# Patient Record
Sex: Male | Born: 1955 | Race: White | Hispanic: No | Marital: Single | Smoking: Current every day smoker
Health system: Southern US, Community
[De-identification: ages and names within clinical notes are randomized; demographics above are authoritative.]

## PROBLEM LIST (undated history)

## (undated) DIAGNOSIS — I10 Essential (primary) hypertension: Secondary | ICD-10-CM

## (undated) DIAGNOSIS — E785 Hyperlipidemia, unspecified: Secondary | ICD-10-CM

## (undated) DIAGNOSIS — E119 Type 2 diabetes mellitus without complications: Secondary | ICD-10-CM

## (undated) HISTORY — DX: Hyperlipidemia, unspecified: E78.5

## (undated) HISTORY — DX: Essential (primary) hypertension: I10

---
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
## 1981-10-07 HISTORY — PX: APPENDECTOMY: SHX54

## 2010-03-06 ENCOUNTER — Emergency Department (HOSPITAL_COMMUNITY): Admission: EM | Admit: 2010-03-06 | Discharge: 2010-03-06 | Payer: Self-pay | Admitting: Emergency Medicine

## 2016-06-16 ENCOUNTER — Inpatient Hospital Stay (HOSPITAL_COMMUNITY)
Admission: EM | Admit: 2016-06-16 | Discharge: 2016-06-18 | DRG: 872 | Disposition: A | Discharge status: Home / Self Care | Payer: Managed Care, Other (non HMO) | Attending: Internal Medicine | Admitting: Internal Medicine

## 2016-06-16 ENCOUNTER — Emergency Department (HOSPITAL_COMMUNITY): Payer: Managed Care, Other (non HMO)

## 2016-06-16 ENCOUNTER — Encounter (HOSPITAL_COMMUNITY): Payer: Self-pay | Admitting: Emergency Medicine

## 2016-06-16 DIAGNOSIS — R739 Hyperglycemia, unspecified: Secondary | ICD-10-CM | POA: Diagnosis present

## 2016-06-16 DIAGNOSIS — R000 Tachycardia, unspecified: Secondary | ICD-10-CM | POA: Diagnosis present

## 2016-06-16 DIAGNOSIS — A419 Sepsis, unspecified organism: Principal | ICD-10-CM | POA: Diagnosis present

## 2016-06-16 DIAGNOSIS — K047 Periapical abscess without sinus: Secondary | ICD-10-CM

## 2016-06-16 DIAGNOSIS — R220 Localized swelling, mass and lump, head: Secondary | ICD-10-CM | POA: Diagnosis not present

## 2016-06-16 DIAGNOSIS — F17210 Nicotine dependence, cigarettes, uncomplicated: Secondary | ICD-10-CM | POA: Diagnosis present

## 2016-06-16 DIAGNOSIS — R17 Unspecified jaundice: Secondary | ICD-10-CM | POA: Diagnosis present

## 2016-06-16 DIAGNOSIS — K920 Hematemesis: Secondary | ICD-10-CM | POA: Diagnosis present

## 2016-06-16 DIAGNOSIS — R112 Nausea with vomiting, unspecified: Secondary | ICD-10-CM | POA: Diagnosis not present

## 2016-06-16 DIAGNOSIS — D751 Secondary polycythemia: Secondary | ICD-10-CM | POA: Diagnosis present

## 2016-06-16 DIAGNOSIS — F17200 Nicotine dependence, unspecified, uncomplicated: Secondary | ICD-10-CM | POA: Diagnosis present

## 2016-06-16 DIAGNOSIS — K121 Other forms of stomatitis: Secondary | ICD-10-CM | POA: Diagnosis not present

## 2016-06-16 LAB — COMPREHENSIVE METABOLIC PANEL
ALK PHOS: 90 U/L (ref 38–126)
ALT: 23 U/L (ref 17–63)
AST: 26 U/L (ref 15–41)
Albumin: 4.4 g/dL (ref 3.5–5.0)
Anion gap: 15 (ref 5–15)
BILIRUBIN TOTAL: 1.9 mg/dL — AB (ref 0.3–1.2)
BUN: 13 mg/dL (ref 6–20)
CALCIUM: 9.7 mg/dL (ref 8.9–10.3)
CHLORIDE: 101 mmol/L (ref 101–111)
CO2: 22 mmol/L (ref 22–32)
CREATININE: 0.94 mg/dL (ref 0.61–1.24)
GFR calc Af Amer: >60 mL/min (ref >60)
Glucose, Bld: 362 mg/dL — ABNORMAL HIGH (ref 65–99)
Potassium: 4.1 mmol/L (ref 3.5–5.1)
Sodium: 138 mmol/L (ref 135–145)
Total Protein: 8.3 g/dL — ABNORMAL HIGH (ref 6.5–8.1)

## 2016-06-16 LAB — CBC WITH DIFFERENTIAL/PLATELET
Basophils Absolute: 0.1 10*3/uL (ref 0.0–0.1)
Basophils Relative: 0 %
Eosinophils Absolute: 0.4 10*3/uL (ref 0.0–0.7)
Eosinophils Relative: 2 %
HEMATOCRIT: 52.1 % — AB (ref 39.0–52.0)
Hemoglobin: 18.7 g/dL — ABNORMAL HIGH (ref 13.0–17.0)
LYMPHS ABS: 2.3 10*3/uL (ref 0.7–4.0)
LYMPHS PCT: 10 %
MCH: 30.1 pg (ref 26.0–34.0)
MCHC: 35.9 g/dL (ref 30.0–36.0)
MCV: 83.8 fL (ref 78.0–100.0)
MONO ABS: 1.8 10*3/uL — AB (ref 0.1–1.0)
MONOS PCT: 8 %
Neutro Abs: 18.7 10*3/uL — ABNORMAL HIGH (ref 1.7–7.7)
Neutrophils Relative %: 80 %
Platelets: 226 10*3/uL (ref 150–400)
RBC: 6.22 MIL/uL — ABNORMAL HIGH (ref 4.22–5.81)
RDW: 13.6 % (ref 11.5–15.5)
WBC: 23.3 10*3/uL — AB (ref 4.0–10.5)

## 2016-06-16 LAB — I-STAT CG4 LACTIC ACID, ED: Lactic Acid, Venous: 1.07 mmol/L (ref 0.5–1.9)

## 2016-06-16 MED ORDER — MORPHINE SULFATE (PF) 4 MG/ML IV SOLN
4.0000 mg | Freq: Once | INTRAVENOUS | Status: AC
Start: 2016-06-16 — End: 2016-06-16
  Administered 2016-06-16: 4 mg via INTRAVENOUS
  Filled 2016-06-16: qty 1

## 2016-06-16 MED ORDER — ONDANSETRON HCL 4 MG/2ML IJ SOLN
4.0000 mg | Freq: Once | INTRAMUSCULAR | Status: AC
  Administered 2016-06-16: 4 mg via INTRAVENOUS
  Filled 2016-06-16: qty 2

## 2016-06-16 MED ORDER — ACETAMINOPHEN 325 MG PO TABS
650.0000 mg | ORAL_TABLET | Freq: Four times a day (QID) | ORAL | Status: DC | PRN
  Administered 2016-06-17 (×2): 650 mg via ORAL
  Filled 2016-06-16 (×2): qty 2

## 2016-06-16 MED ORDER — MORPHINE SULFATE (PF) 4 MG/ML IV SOLN
4.0000 mg | INTRAVENOUS | Status: DC | PRN
  Administered 2016-06-17 (×2): 4 mg via INTRAVENOUS
  Filled 2016-06-16 (×2): qty 1

## 2016-06-16 MED ORDER — SODIUM CHLORIDE 0.9 % IV BOLUS (SEPSIS)
30.0000 mL/kg | Freq: Once | INTRAVENOUS | Status: AC
  Administered 2016-06-16: 2000 mL via INTRAVENOUS

## 2016-06-16 MED ORDER — ONDANSETRON HCL 4 MG/2ML IJ SOLN: 4.0000 mg | Freq: Four times a day (QID) | INTRAMUSCULAR | Status: DC | PRN

## 2016-06-16 MED ORDER — ONDANSETRON HCL 4 MG PO TABS: 4.0000 mg | ORAL_TABLET | Freq: Four times a day (QID) | ORAL | Status: DC | PRN

## 2016-06-16 MED ORDER — MORPHINE SULFATE (PF) 4 MG/ML IV SOLN
4.0000 mg | Freq: Once | INTRAVENOUS | Status: AC
  Administered 2016-06-16: 4 mg via INTRAVENOUS
  Filled 2016-06-16: qty 1

## 2016-06-16 MED ORDER — SODIUM CHLORIDE 0.9% FLUSH
3.0000 mL | Freq: Two times a day (BID) | INTRAVENOUS | Status: DC
  Administered 2016-06-17: 3 mL via INTRAVENOUS

## 2016-06-16 MED ORDER — SODIUM CHLORIDE 0.9 % IV SOLN
3.0000 g | Freq: Four times a day (QID) | INTRAVENOUS | Status: DC
  Administered 2016-06-16 – 2016-06-18 (×7): 3 g via INTRAVENOUS
  Filled 2016-06-16 (×8): qty 3

## 2016-06-16 MED ORDER — SODIUM CHLORIDE 0.9 % IV SOLN
INTRAVENOUS | Status: DC
  Administered 2016-06-17 – 2016-06-18 (×2): via INTRAVENOUS

## 2016-06-16 MED ORDER — KETOROLAC TROMETHAMINE 30 MG/ML IJ SOLN
30.0000 mg | Freq: Once | INTRAMUSCULAR | Status: AC
  Administered 2016-06-16: 30 mg via INTRAVENOUS
  Filled 2016-06-16: qty 1

## 2016-06-16 MED ORDER — SODIUM CHLORIDE 0.9 % IV BOLUS (SEPSIS)
1000.0000 mL | Freq: Once | INTRAVENOUS | Status: AC
  Administered 2016-06-16: 1000 mL via INTRAVENOUS

## 2016-06-16 MED ORDER — KETOROLAC TROMETHAMINE 30 MG/ML IJ SOLN
30.0000 mg | Freq: Four times a day (QID) | INTRAMUSCULAR | Status: DC | PRN
  Administered 2016-06-17 – 2016-06-18 (×4): 30 mg via INTRAVENOUS
  Filled 2016-06-16 (×4): qty 1

## 2016-06-16 MED ORDER — IOPAMIDOL (ISOVUE-300) INJECTION 61%
100.0000 mL | Freq: Once | INTRAVENOUS | Status: AC | PRN
  Administered 2016-06-16: 100 mL via INTRAVENOUS

## 2016-06-16 MED ORDER — INSULIN ASPART 100 UNIT/ML ~~LOC~~ SOLN
0.0000 [IU] | Freq: Three times a day (TID) | SUBCUTANEOUS | Status: DC
Start: 2016-06-17 — End: 2016-06-17
  Administered 2016-06-17 (×2): 5 [IU] via SUBCUTANEOUS

## 2016-06-16 MED ORDER — FAMOTIDINE IN NACL 20-0.9 MG/50ML-% IV SOLN
20.0000 mg | Freq: Two times a day (BID) | INTRAVENOUS | Status: DC
  Administered 2016-06-16 – 2016-06-18 (×4): 20 mg via INTRAVENOUS
  Filled 2016-06-16 (×5): qty 50

## 2016-06-16 NOTE — ED Provider Notes (Signed)
[facility] DEPT Provider Note   CSN: [id number] Arrival date & time: 06/16/16  1545     History   Chief Complaint Chief Complaint  Patient presents with  . Hematemesis    HPI Steve Keller is a 60 y.o. male.  HPI here for evaluation of hematemesis. Patient reports he had a dental infection and gradually got worse since yesterday, went to a walk-in clinic today where he was prescribed amoxicillin. He reports taking his amoxicillin at 9:00 AM and since that time has thrown up bloody emesis twice. He also reports associated epistaxis. Reports right cheek pain. Denies overt chest pain, shortness of breath, cough, back pain, abdominal pain, headache, numbness or weakness, fevers or chills. Denies any other medical problems, no other medications. Nothing makes this problem better or worse. No other modifying factors  History reviewed. No pertinent past medical history.  There are no active problems to display for this patient.   Past Surgical History:  Procedure Laterality Date  . APPENDECTOMY         Home Medications    Prior to Admission medications   Not on File    Family History No family history on file.  Social History Social History  Substance Use Topics  . Smoking status: Current Every Day Smoker    Types: Cigarettes  . Smokeless tobacco: Never Used  . Alcohol use No     Allergies   Review of patient's allergies indicates no known allergies.   Review of Systems Review of Systems A 10 point review of systems was completed and was negative except for pertinent positives and negatives as mentioned in the history of present illness    Physical Exam Updated Vital Signs BP (!) 159/108 (BP Location: Left Arm)   Pulse (!) 123   Temp 97.9 F (36.6 C) (Oral)   Resp 20   Ht 6\' 1"  (1.854 m)   Wt 109.3 kg   SpO2 94%   BMI 31.80 kg/m   Physical Exam  Constitutional: He appears well-developed and well-nourished. No distress.  Awake, alert and  nontoxic in appearance  HENT:  Head: Normocephalic and atraumatic.  Right Ear: External ear normal.  Left Ear: External ear normal.  Mouth/Throat: Oropharynx is clear and moist.  Overall poor dentition. Multiple missing teeth with active caries. Worst in right lower molars Mucous membranes are moist. No unilateral tonsillar swelling, uvula midline, no glossal swelling or elevation. No trismus. No fluctuance or evidence of a drainable abscess. No evidence of Retropharyngeal or Peritonsillar abscess, Ludwig or Vincents angina. Tolerating secretions well. Patent airway  He does have exquisite tenderness over right maxillary sinus and cheek area, slightly erythematous and is warm.   Eyes: Conjunctivae and EOM are normal. Pupils are equal, round, and reactive to light.  Neck: Normal range of motion. Neck supple. No JVD present.  Cardiovascular: Regular rhythm and normal heart sounds.   Tachycardic with normal heart sounds.  Pulmonary/Chest: Effort normal and breath sounds normal. No stridor. No respiratory distress.  Abdominal: Soft. He exhibits no distension. There is no tenderness.  Musculoskeletal: Normal range of motion.  Neurological: He is alert.  Awake, alert, cooperative and aware of situation; motor strength bilaterally; sensation normal to light touch bilaterally; no facial asymmetry; tongue midline; major cranial nerves appear intact;  baseline gait without new ataxia.  Skin: No rash noted. He is not diaphoretic.  Psychiatric: He has a normal mood and affect. His behavior is normal. Thought content normal.  Nursing note and vitals reviewed.  ED Treatments / Results  Labs (all labs ordered are listed, but only abnormal results are displayed) Labs Reviewed  COMPREHENSIVE METABOLIC PANEL - Abnormal; Notable for the following:       Result Value   Glucose, Bld 362 (*)    Total Protein 8.3 (*)    Total Bilirubin 1.9 (*)    All other components within normal limits  CBC WITH  DIFFERENTIAL/PLATELET - Abnormal; Notable for the following:    WBC 23.3 (*)    RBC 6.22 (*)    Hemoglobin 18.7 (*)    HCT 52.1 (*)    Neutro Abs 18.7 (*)    Monocytes Absolute 1.8 (*)    All other components within normal limits  CULTURE, BLOOD (ROUTINE X 2)  CULTURE, BLOOD (ROUTINE X 2)  I-STAT CG4 LACTIC ACID, ED    EKG  EKG Interpretation None       Radiology Ct Maxillofacial W Contrast  Result Date: 06/16/2016 CLINICAL DATA:  Right cheek swelling and redness. EXAM: CT MAXILLOFACIAL WITH CONTRAST TECHNIQUE: Multidetector CT imaging of the maxillofacial structures was performed with intravenous contrast. Multiplanar CT image reconstructions were also generated. A small metallic BB was placed on the right temple in order to reliably differentiate right from left. CONTRAST:  167mL ISOVUE-300 IOPAMIDOL (ISOVUE-300) INJECTION 61% COMPARISON:  None FINDINGS: Very poor dentition with multiple upper and lower periapical lucencies. There is a periapical lucency involving the right upper second molar. There is adjacent mucosal thickening involving the right maxillary sinus. There is a small periosteal fluid collection overlying the anterior lateral aspect of the right side of maxilla which measures 7 by 18 mm, image 39 of series 13. Overlying subcutaneous fat stranding and skin thickening is identified. Prominent right submandibular lymph node 9 mm in short axis. Right level-II node measures 1 cm, image 19 of series 3. The visualized intracranial contents are unremarkable. IMPRESSION: 1. Overall poor dentition with multiple periapical lucencies. 2. Right upper second molar periapical abscess with associated right maxillary sinus inflammation. Additionally, there is a small periosteal fluid collection/abscess associated with the right maxilla and overlying cellulitis. 3. Borderline enlarged right level 1 and 2 nodes are likely reactive. Electronically Signed   By: Kerby Moors M.D.   On: 06/16/2016  18:04   Dg Abd Acute W/chest  Result Date: 06/16/2016 CLINICAL DATA:  Vomiting EXAM: DG ABDOMEN ACUTE W/ 1V CHEST COMPARISON:  None. FINDINGS: PA view chest demonstrates mild linear atelectasis or scarring in the left base. No acute infiltrate or consolidation. No effusion. Heart size is normal. Mild atherosclerosis of the aorta. Supine and upright views of the abdomen demonstrate no free air beneath the diaphragm. Bowel gas pattern nonspecific with relatively decreased small bowel gas. There is gas and stool present in the colon. There are no pathologic calcifications. Ovoid soft tissue opacity in the pelvis, it is felt consistent with bladder. No acute osseous abnormality. IMPRESSION: 1. Mild scarring or atelectasis in the left lung base 2. Nonspecific nonobstructed bowel gas pattern Electronically Signed   By: Donavan Foil M.D.   On: 06/16/2016 17:28    Procedures Procedures (including critical care time)  Medications Ordered in ED Medications  sodium chloride 0.9 % bolus 1,000 mL (0 mLs Intravenous Stopped 06/16/16 1810)  morphine 4 MG/ML injection 4 mg (4 mg Intravenous Given 06/16/16 1701)  ondansetron (ZOFRAN) injection 4 mg (4 mg Intravenous Given 06/16/16 1701)  iopamidol (ISOVUE-300) 61 % injection 100 mL (100 mLs Intravenous Contrast Given 06/16/16 1738)  sodium  chloride 0.9 % bolus 3,279 mL (1,000 mLs Intravenous New Bag/Given 06/16/16 1809)  morphine 4 MG/ML injection 4 mg (4 mg Intravenous Given 06/16/16 1816)     Initial Impression / Assessment and Plan / ED Course  I have reviewed the triage vital signs and the nursing notes.  Pertinent labs & imaging results that were available during my care of the patient were reviewed by me and considered in my medical decision making (see chart for details).  Clinical Course    On arrival, patient tachycardic to 130s. Exam concerning for dental infection versus maxillary sinus infection. We will obtain CT maxillofacial for further  appreciation. Reports forceful vomiting, obtain acute abdomen/chest to rule out possible perforation. Pending labs. White count 23. Multiple periapical Lucencies on CT maxillofacial. Patient will need admission for IV antibiotics for multiple dental abscesses. Patient treated for sepsis secondary to dental abscess. Started on Unasyn. Cultures obtained, fluids given. Discussed with hospitalist, Dr. Olevia Bowens. Will see in ED.   Final Clinical Impressions(s) / ED Diagnoses   Final diagnoses:  Dental infection  Sepsis, due to unspecified organism [facility])    New Prescriptions New Prescriptions   No medications on file     Comer Locket, PA-C 06/16/16 2108    Leo Grosser, MD 06/17/16 1049

## 2016-06-16 NOTE — ED Triage Notes (Signed)
Patient states that he was seen by walk in clinic earlier for dental abscess and was given antibiotics and took one this morning then home resting then got abd tightness then vomited dark red blood x 3.

## 2016-06-16 NOTE — H&P (Signed)
History and Physical    Steve Keller [id number] DOB: 1956/02/25 DOA: 06/16/2016  PCP: No primary care provider on file.   Patient coming from: Home.  Chief Complaint: Dental abscess.  HPI: Steve Keller is a 60 y.o. male with medical/surgical history significant of appendectomy who comes into the emergency department due to dental abscess symptoms since Friday.  Per patient, he was driving back to [place] from [place], where he works, when he had sudden pain in his right maxillary area. He is states that the pain became so intense quickly, that he had to stop driving and take some over-the-counter analgesics. He is states that the pain improved on Saturday, but then worsening again, so he went to the walk-in clinic this morning for evaluation. He was prescribed amoxicillin, went home and shortly after he took the first amoxicillin tablet, he developed abdominal pain with nausea followed by 3 episodes of emesis which the patient describes as having dark red blood. He states that he has taken numerous tablets of Excedrin for pain. He denies fever, but feels chills and rigors.  He denies chest pain, palpitations, dizziness, diaphoresis, dyspnea, pitting edema of the lower extremities, PND or orthopnea.He denies melena, hematochezia, or GU symptoms.  ED Course: The patient received 3+ liters of IV fluid bolus, Zofran, morphine 8 mg divided in 2 doses, Toradol 30 mg IVP and was started on Unasyn 3 g every 3 hours. Workup showed leukocytosis of 20 3.3K, hemoglobin level of 18.7, hyperglycemia 0367 mg/dL and CT scan imaging revealed a right upper second molar periapical abscess with associated right maxillary sinus inflammation (see report for further detail).   Review of Systems: As per HPI otherwise 10 point review of systems negative.    History reviewed. No pertinent past medical history.  Past Surgical History:  Procedure Laterality Date  . APPENDECTOMY       reports that he  has been smoking Cigarettes.  He has never used smokeless tobacco. He reports that he does not drink alcohol. His drug history is not on file.  No Known Allergies  Family History  Problem Relation Age of Onset  . CVA Father   . Diabetes Mellitus II Father     Prior to Admission medications   Medication Sig Start Date End Date Taking? Authorizing Provider  acetaminophen (TYLENOL) 500 MG tablet Take 1,000 mg by mouth every 6 (six) hours as needed for mild pain, moderate pain or headache.   Yes Historical Provider, MD    Physical Exam: Vitals:   06/16/16 1607 06/16/16 1608 06/16/16 1722 06/16/16 1816  BP:  (!) 168/111 (!) 158/112 (!) 159/108  Pulse:  (!) 135 (!) 131 (!) 123  Resp:  19 18 20   Temp:  97.9 F (36.6 C)    TempSrc:  Oral    SpO2:  97%  94%  Weight: 109.3 kg (241 lb)     Height: 6\' 1"  (1.854 m)         Constitutional: NAD, calm, comfortable Vitals:   06/16/16 1607 06/16/16 1608 06/16/16 1722 06/16/16 1816  BP:  (!) 168/111 (!) 158/112 (!) 159/108  Pulse:  (!) 135 (!) 131 (!) 123  Resp:  19 18 20   Temp:  97.9 F (36.6 C)    TempSrc:  Oral    SpO2:  97%  94%  Weight: 109.3 kg (241 lb)     Height: 6\' 1"  (1.854 m)      Eyes: PERRL, lids and conjunctivae normal ENMT: Positive edema with mild  erythema and tenderness to palpation in right maxillary area. Mucous membranes are moist. Posterior pharynx clear of any exudate or lesions. Poor dentition with numerous missing teeth and multiple cavities. Neck: supple, right-sided cervical adenopathies, no thyromegaly Respiratory: clear to auscultation bilaterally, no wheezing, no crackles. Normal respiratory effort. No accessory muscle use.  Cardiovascular: Tachycardic at 114 BPM, no murmurs / rubs / gallops. No extremity edema. 2+ pedal pulses. No carotid bruits.  Abdomen: no tenderness, no masses palpated. No hepatosplenomegaly. Bowel sounds positive.  Musculoskeletal: no clubbing / cyanosis. No joint deformity upper and  lower extremities. Good ROM, no contractures. Normal muscle tone.  Skin: no rashes, lesions, ulcers. No induration Neurologic: CN 2-12 grossly intact. Sensation intact, DTR normal. Strength 5/5 in all 4.  Psychiatric: Normal judgment and insight. Alert and oriented x 4. Normal mood.     Labs on Admission: I have personally reviewed following labs and imaging studies  CBC:  Recent Labs Lab 06/16/16 1645  WBC 23.3*  NEUTROABS 18.7*  HGB 18.7*  HCT 52.1*  MCV 83.8  PLT [id number]   Basic Metabolic Panel:  Recent Labs Lab 06/16/16 1645  NA 138  K 4.1  CL 101  CO2 22  GLUCOSE 362*  BUN 13  CREATININE 0.94  CALCIUM 9.7   GFR: Estimated Creatinine Clearance: 108.4 mL/min (by C-G formula based on SCr of 0.94 mg/dL). Liver Function Tests:  Recent Labs Lab 06/16/16 1645  AST 26  ALT 23  ALKPHOS 90  BILITOT 1.9*  PROT 8.3*  ALBUMIN 4.4     Radiological Exams on Admission: Ct Maxillofacial W Contrast  Result Date: 06/16/2016 CLINICAL DATA:  Right cheek swelling and redness. EXAM: CT MAXILLOFACIAL WITH CONTRAST TECHNIQUE: Multidetector CT imaging of the maxillofacial structures was performed with intravenous contrast. Multiplanar CT image reconstructions were also generated. A small metallic BB was placed on the right temple in order to reliably differentiate right from left. CONTRAST:  116mL ISOVUE-300 IOPAMIDOL (ISOVUE-300) INJECTION 61% COMPARISON:  None FINDINGS: Very poor dentition with multiple upper and lower periapical lucencies. There is a periapical lucency involving the right upper second molar. There is adjacent mucosal thickening involving the right maxillary sinus. There is a small periosteal fluid collection overlying the anterior lateral aspect of the right side of maxilla which measures 7 by 18 mm, image 39 of series 13. Overlying subcutaneous fat stranding and skin thickening is identified. Prominent right submandibular lymph node 9 mm in short axis. Right level-II  node measures 1 cm, image 19 of series 3. The visualized intracranial contents are unremarkable. IMPRESSION: 1. Overall poor dentition with multiple periapical lucencies. 2. Right upper second molar periapical abscess with associated right maxillary sinus inflammation. Additionally, there is a small periosteal fluid collection/abscess associated with the right maxilla and overlying cellulitis. 3. Borderline enlarged right level 1 and 2 nodes are likely reactive. Electronically Signed   By: Kerby Moors M.D.   On: 06/16/2016 18:04   Dg Abd Acute W/chest  Result Date: 06/16/2016 CLINICAL DATA:  Vomiting EXAM: DG ABDOMEN ACUTE W/ 1V CHEST COMPARISON:  None. FINDINGS: PA view chest demonstrates mild linear atelectasis or scarring in the left base. No acute infiltrate or consolidation. No effusion. Heart size is normal. Mild atherosclerosis of the aorta. Supine and upright views of the abdomen demonstrate no free air beneath the diaphragm. Bowel gas pattern nonspecific with relatively decreased small bowel gas. There is gas and stool present in the colon. There are no pathologic calcifications. Ovoid soft tissue opacity in  the pelvis, it is felt consistent with bladder. No acute osseous abnormality. IMPRESSION: 1. Mild scarring or atelectasis in the left lung base 2. Nonspecific nonobstructed bowel gas pattern Electronically Signed   By: Donavan Foil M.D.   On: 06/16/2016 17:28     Assessment/Plan Principal Problem:   Sepsis due to oral infection ([place]) Admit to telemetry a/inpatient. Continue normal saline boluses, then maintenance IV fluids. Tylenol 650 mg by mouth every 6 hours when necessary. Toradol 30 mg IVP every 6 hours when necessary Morphine 4 mg IVP every 3 hours when necessary. Antiemetics as needed. Continue Unasyn 3 g every 6 hours. Patient advised to follow-up with dentist as an outpatient in the future.  Active Problems:   Nausea and vomiting Per patient, he had 3 episodes of  hematemesis.  He has stated that he has been taking Excedrin often for this pain.  Check stool occult blood.  Continue IV hydration. Famotidine 20 mg IVP every 12 hours. Zofran 4 mg oral/IVP every 4 hours.    Hyperglycemia The patient denies polyuria, polydipsia or blurred vision. Start CBG monitoring a regular insulin sliding scale. Check hemoglobin A1c.    Sinus tachycardia Check electrocardiogram. Continue IV hydration and pain control. Check echocardiogram if it persists despite hydration and treatment.      Polycythemia Advised to cease smoking. Consider daily aspirin 81 mg prophylaxis as an outpatient, once GI symptoms resolved. Follow-up with PCP and/or hematology as an outpatient.    Hyperbilirubinemia No history of liver disease per patient. States that he drinks very seldom and in limited amounts.  Follow hepatic liver function test in a.m.    Tobacco use disorder Smokes about one third of a pack of cigarettes a day. Declined nicotine replacement therapy.     DVT prophylaxis: SCDs. Code Status: Full code. Family Communication:  Disposition Plan: Admit for IV antibiotic therapy and further evaluation. Consults called:  Admission status: Telemetry/inpatient.   Reubin Milan MD [facility] Pager [phone].(  If 7PM-7AM, please contact night-coverage [email or web address] Password [email or web address]  06/16/2016, 7:52 PM

## 2016-06-16 NOTE — Progress Notes (Signed)
Pharmacy Antibiotic Note  Steve Keller is a 60 y.o. male admitted on 06/16/2016 with dental abscess.  Pharmacy has been consulted for ampicillin/sulbactam dosing.  Today, 06/16/2016:  Renal: Scr WNL  CBC: WBC elevated  Plan:  Ampicillin/sulbactam 3gm IV q6h  Do not anticipate need for dose adjustment, pharmacy will sign-off  Height: 6\' 1"  (185.4 cm) Weight: 241 lb (109.3 kg) IBW/kg (Calculated) : 79.9  Temp (24hrs), Avg:97.9 F (36.6 C), Min:97.9 F (36.6 C), Max:97.9 F (36.6 C)   Recent Labs Lab 06/16/16 1645  WBC 23.3*  CREATININE 0.94    Estimated Creatinine Clearance: 108.4 mL/min (by C-G formula based on SCr of 0.94 mg/dL).    No Known Allergies  Antimicrobials this admission: Unasyn 9/10 >>  Dose adjustments this admission:  Microbiology results: No cultures  Thank you for allowing pharmacy to be a part of this patient's care.  Doreene Eland, PharmD, BCPS.   Pager: [phone] 06/16/2016 6:34 PM

## 2016-06-17 ENCOUNTER — Encounter (HOSPITAL_COMMUNITY): Payer: Self-pay | Admitting: Dentistry

## 2016-06-17 DIAGNOSIS — A419 Sepsis, unspecified organism: Principal | ICD-10-CM

## 2016-06-17 DIAGNOSIS — F17200 Nicotine dependence, unspecified, uncomplicated: Secondary | ICD-10-CM

## 2016-06-17 DIAGNOSIS — K121 Other forms of stomatitis: Secondary | ICD-10-CM

## 2016-06-17 DIAGNOSIS — R220 Localized swelling, mass and lump, head: Secondary | ICD-10-CM

## 2016-06-17 DIAGNOSIS — R112 Nausea with vomiting, unspecified: Secondary | ICD-10-CM

## 2016-06-17 DIAGNOSIS — R739 Hyperglycemia, unspecified: Secondary | ICD-10-CM

## 2016-06-17 LAB — CBC WITH DIFFERENTIAL/PLATELET
BASOS ABS: 0.1 10*3/uL (ref 0.0–0.1)
Basophils Relative: 1 %
EOS ABS: 0.6 10*3/uL (ref 0.0–0.7)
EOS PCT: 6 %
HCT: 41.3 % (ref 39.0–52.0)
Hemoglobin: 14.1 g/dL (ref 13.0–17.0)
LYMPHS PCT: 23 %
Lymphs Abs: 2.4 10*3/uL (ref 0.7–4.0)
MCH: 28.8 pg (ref 26.0–34.0)
MCHC: 34.1 g/dL (ref 30.0–36.0)
MCV: 84.5 fL (ref 78.0–100.0)
MONO ABS: 0.8 10*3/uL (ref 0.1–1.0)
Monocytes Relative: 8 %
Neutro Abs: 6.5 10*3/uL (ref 1.7–7.7)
Neutrophils Relative %: 63 %
PLATELETS: 175 10*3/uL (ref 150–400)
RBC: 4.89 MIL/uL (ref 4.22–5.81)
RDW: 13.5 % (ref 11.5–15.5)
WBC: 10.4 10*3/uL (ref 4.0–10.5)

## 2016-06-17 LAB — GLUCOSE, CAPILLARY
GLUCOSE-CAPILLARY: 216 mg/dL — AB (ref 65–99)
GLUCOSE-CAPILLARY: 246 mg/dL — AB (ref 65–99)
GLUCOSE-CAPILLARY: 246 mg/dL — AB (ref 65–99)
Glucose-Capillary: 223 mg/dL — ABNORMAL HIGH (ref 65–99)
Glucose-Capillary: 216 mg/dL — ABNORMAL HIGH (ref 65–99)

## 2016-06-17 LAB — COMPREHENSIVE METABOLIC PANEL
ALT: 19 U/L (ref 17–63)
AST: 16 U/L (ref 15–41)
Albumin: 3.2 g/dL — ABNORMAL LOW (ref 3.5–5.0)
Alkaline Phosphatase: 59 U/L (ref 38–126)
Anion gap: 9 (ref 5–15)
BUN: 10 mg/dL (ref 6–20)
CHLORIDE: 102 mmol/L (ref 101–111)
CO2: 25 mmol/L (ref 22–32)
Calcium: 7.7 mg/dL — ABNORMAL LOW (ref 8.9–10.3)
Creatinine, Ser: 0.64 mg/dL (ref 0.61–1.24)
Glucose, Bld: 240 mg/dL — ABNORMAL HIGH (ref 65–99)
POTASSIUM: 3.5 mmol/L (ref 3.5–5.1)
SODIUM: 136 mmol/L (ref 135–145)
Total Bilirubin: 1 mg/dL (ref 0.3–1.2)
Total Protein: 5.7 g/dL — ABNORMAL LOW (ref 6.5–8.1)

## 2016-06-17 MED ORDER — LIVING WELL WITH DIABETES BOOK
Freq: Once | Status: AC
  Administered 2016-06-17: 14:00:00
  Filled 2016-06-17: qty 1

## 2016-06-17 MED ORDER — TRAMADOL HCL 50 MG PO TABS
50.0000 mg | ORAL_TABLET | Freq: Four times a day (QID) | ORAL | Status: DC | PRN
  Administered 2016-06-17 – 2016-06-18 (×2): 50 mg via ORAL
  Filled 2016-06-17 (×2): qty 1

## 2016-06-17 MED ORDER — MORPHINE SULFATE (PF) 2 MG/ML IV SOLN: 2.0000 mg | INTRAVENOUS | Status: DC | PRN

## 2016-06-17 MED ORDER — INSULIN ASPART 100 UNIT/ML ~~LOC~~ SOLN
0.0000 [IU] | Freq: Every day | SUBCUTANEOUS | Status: DC
  Administered 2016-06-17: 2 [IU] via SUBCUTANEOUS

## 2016-06-17 MED ORDER — INSULIN ASPART 100 UNIT/ML ~~LOC~~ SOLN
0.0000 [IU] | Freq: Three times a day (TID) | SUBCUTANEOUS | Status: DC
  Administered 2016-06-17 – 2016-06-18 (×3): 7 [IU] via SUBCUTANEOUS

## 2016-06-17 NOTE — Progress Notes (Signed)
Inpatient Diabetes Program Recommendations  AACE/ADA: New Consensus Statement on Inpatient Glycemic Control (2015)  Target Ranges:  Prepandial:   less than 140 mg/dL      Peak postprandial:   less than 180 mg/dL (1-2 hours)      Critically ill patients:  140 - 180 mg/dL   Lab Results  Component Value Date   GLUCAP 223 (H) 06/17/2016    Review of Glycemic Control  Diabetes history: None Outpatient Diabetes medications:None Current orders for Inpatient glycemic control: Novolog moderate tidwc HgbA1C pending. Likely new diagnosis DM2  Inpatient Diabetes Program Recommendations:    Add Lantus 20 units QHS Consider increasing Novolog to resistant tidwc and hs Add HS correction.  Will send Living Well With Diabetes book and begin education.  Will follow.  Thank you. Lorenda Peck, RD, LDN, CDE Inpatient Diabetes Coordinator [phone]

## 2016-06-17 NOTE — Progress Notes (Signed)
Patient arrival to floor from ED. Vitals: 115/78, 115, 98.3, 20, 99%. Patient A&Ox4. Left FA IV infusing. Skin intact. No c/o pain at this time. Will continue to monitor.

## 2016-06-17 NOTE — Consult Note (Signed)
DENTAL CONSULTATION  Date of Consultation:  06/17/2016 Patient Name:   Steve Keller Date of Birth:   11-22-1955 Medical Record Number: [id number]  VITALS: BP (!) 146/88 (BP Location: Right Arm)   Pulse 95   Temp 97.9 F (36.6 C) (Oral)   Resp 18   Ht 6\' 1"  (1.854 m)   Wt 241 lb (109.3 kg)   SpO2 98%   BMI 31.80 kg/m   CHIEF COMPLAINT: Patient was referred by Dr. Eliseo Squires for dental consultation.  HPI: Steve Keller is a 60 year old male recently admitted with a history of right facial swelling. A dental consultation was then requested to evaluate patient for dental etiology and provide treatment as indicated.  Patient has a history of right facial pain and swelling that started approximately Friday, 06/25/2016. The patient had some intermittent acute sharp pain to the right side of the face that lasted for hours only. Patient took some over-the-counter pain medication with good relief. Patient indicated that the pain worsened and he presented to the urgent care center on Sunday morning. Patient was given a prescription for amoxicillin at that time. Patient then developed significant nausea and vomiting and increased pain and discomfort associated with the right facial swelling. Patient then presented to the emergency room at [facility] on Sunday afternoon and was admitted and placed on IV antibiotic therapy. A dental consultation was then requested today to evaluate the patient. Patient currently denies having any significant discomfort from the right side of his face. Patient denies having any toothache symptoms but points to the area of tooth numbers 4/5 as being the area where the swelling is most prevalent. Patient has not seen a dentist for the past 6 years. Patient saw a dentist in [place] at that time. Patient was last seen for an exam and cleaning and  several crown restorations. Patient denies having dental phobia.    PROBLEM LIST: Patient Active Problem List    Diagnosis Date Noted  . Sepsis due to oral infection ([place]) 06/16/2016  . Hyperglycemia 06/16/2016  . Polycythemia 06/16/2016  . Hyperbilirubinemia 06/16/2016  . Tobacco use disorder 06/16/2016  . Sinus tachycardia ([place]) 06/16/2016  . Nausea and vomiting 06/16/2016    PMH: History reviewed. No pertinent past medical history.  PSH: Past Surgical History:  Procedure Laterality Date  . APPENDECTOMY      ALLERGIES: No Known Allergies  MEDICATIONS: Current Facility-Administered Medications  Medication Dose Route Frequency Provider Last Rate Last Dose  . 0.9 %  sodium chloride infusion   Intravenous Continuous Geradine Girt, DO 75 mL/hr at 06/17/16 1210    . acetaminophen (TYLENOL) tablet 650 mg  650 mg Oral Q6H PRN Reubin Milan, MD   650 mg at 06/17/16 [id number]  . Ampicillin-Sulbactam (UNASYN) 3 g in sodium chloride 0.9 % 100 mL IVPB  3 g Intravenous Q6H Berton Mount, RPH 100 mL/hr at 06/17/16 1210 3 g at 06/17/16 1210  . famotidine (PEPCID) IVPB 20 mg premix  20 mg Intravenous Q12H Reubin Milan, MD   20 mg at 06/17/16 0854  . insulin aspart (novoLOG) injection 0-15 Units  0-15 Units Subcutaneous TID WC Reubin Milan, MD   5 Units at 06/17/16 1222  . ketorolac (TORADOL) 30 MG/ML injection 30 mg  30 mg Intravenous Q6H PRN Reubin Milan, MD   30 mg at 06/17/16 1210  . living well with diabetes book MISC   Does not apply Once Geradine Girt, DO      .  morphine 4 MG/ML injection 4 mg  4 mg Intravenous Q3H PRN Reubin Milan, MD   4 mg at 06/17/16 0533  . ondansetron (ZOFRAN) tablet 4 mg  4 mg Oral Q6H PRN Reubin Milan, MD       Or  . ondansetron [facility]) injection 4 mg  4 mg Intravenous Q6H PRN Reubin Milan, MD      . sodium chloride flush (NS) 0.9 % injection 3 mL  3 mL Intravenous Q12H Reubin Milan, MD        LABS: Lab Results  Component Value Date   WBC 10.4 06/17/2016   HGB 14.1 06/17/2016   HCT 41.3 06/17/2016   MCV 84.5 06/17/2016    PLT 175 06/17/2016      Component Value Date/Time   NA 136 06/17/2016 0509   K 3.5 06/17/2016 0509   CL 102 06/17/2016 0509   CO2 25 06/17/2016 0509   GLUCOSE 240 (H) 06/17/2016 0509   BUN 10 06/17/2016 0509   CREATININE 0.64 06/17/2016 0509   CALCIUM 7.7 (L) 06/17/2016 0509   GFRNONAA >60 06/17/2016 0509   GFRAA >60 06/17/2016 0509   No results found for: INR, PROTIME No results found for: PTT  SOCIAL HISTORY: Social History   Social History  . Marital status: Single    Spouse name: N/A  . Number of children: N/A  . Years of education: N/A   Occupational History  . Not on file.   Social History Main Topics  . Smoking status: Current Every Day Smoker    Types: Cigarettes  . Smokeless tobacco: Never Used  . Alcohol use No  . Drug use: Unknown  . Sexual activity: Not on file   Other Topics Concern  . Not on file   Social History Narrative  . No narrative on file    FAMILY HISTORY: Family History  Problem Relation Age of Onset  . CVA Father   . Diabetes Mellitus II Father     REVIEW OF SYSTEMS: Reviewed The patient as per history of present illness.  Psych: Patient denies dental phobia.   DENTAL HISTORY: CHIEF COMPLAINT: Patient was referred by Dr. Eliseo Squires for dental consultation.  HPI: Steve Keller is a 60 year old male recently admitted with a history of right facial swelling. A dental consultation was then requested to evaluate patient for dental etiology and provide treatment as indicated.  Patient has a history of right facial pain and swelling that started approximately Friday, 06/25/2016. The patient had some intermittent acute sharp pain to the right side of the face that lasted for hours only. Patient takes some over-the-counter pain medication with good relief. Patient indicated that the pain worsened and he presented to the urgent care center on Sunday morning. Patient was given a prescription for amoxicillin at that time. Patient then  developed significant nausea and vomiting and increased pain and discomfort associated with the right facial swelling. Patient then presented to the emergency room at [facility] on Sunday afternoon and was admitted and placed on IV antibiotic therapy. A dental consultation was then requested today to evaluate the patient. Patient currently denies having any significant discomfort from the right side of his face. Patient denies having any toothache symptoms but points to the area of tooth numbers 4/5 as being the area where the swelling is most prevalent. Patient has not seen a dentist for the past 6 years. Patient saw a dentist in [place] at that time. Patient was last seen for an  exam and cleaning and  several crown restorations. Patient denies having dental phobia.   DENTAL EXAMINATION: GENERAL:Patient is a well-developed, well-nourished male in no acute distress.  HEAD AND NECK:Patient has right facial swelling. Patient has right neck lymphadenopathy that is tender to palpation. Patient denies acute TMJ symptoms. Patient has maximum interincisal opening of 40 mm. INTRAORAL EXAM:Patient has xerostomia. Patient has a buccal abscess in the area of tooth numbers 4 and 5.  DENTITION:Patient is missing tooth numbers 1, 3, 16, 17, 19, and 32. There are retained root segments in the area tooth numbers 4, 5, and, 15, 16, 21.  PERIODONTAL:Patient has chronic periodontitis with plaque and calculus accumulations, gingival recession, and tooth mobility. DENTAL CARIES/SUBOPTIMAL RESTORATIONS:Multiple dental caries are noted.  ENDODONTIC:Patient has a history of acute pulpitis symptoms. Patient has multiple areas of periapical pathology and radiolucency.  [place] has crown on tooth #13.  PROSTHODONTIC:Patient denies having partial dentures.  OCCLUSION:Patient has a poor occlusal scheme  RADIOGRAPHIC INTERPRETATION: An orthopantogram was taken  There are multiple missing teeth. There  are multiple retained root segments. Dental caries are noted. There is moderate bone to severe bone loss noted. There are multiple areas of periapical pathology and radiolucency. There is supra-eruption and drifting of the unopposed teeth into the edentulous areas. The right maxillary sinus is opacified.   ASSESSMENTS: 1. Right facial swelling 2. Periapical abscess in the area #4/5 with no fistulous tract 3. Chronic apical periodontitis 4. Multiple retained root segments 5. Dental caries 6. Bilateral mandibular lingual tori 7. Chronic periodontitis with bone loss 8. Gingival recession 9. Accretions 10. Incipient tooth mobility 11. Multiple missing teeth 12. Supra-eruption and drifting of the unopposed teeth into the edentulous areas  13. Poor occlusal scheme and malocclusion.   PLAN/RECOMMENDATIONS: 1. I discussed the risks, benefits, and complications of various treatment options with the patient in relationship to the medical and dental conditions. We discussed various treatment options to include no treatment, multiple extractions with alveoloplasty, pre-prosthetic surgery as indicated, periodontal therapy, dental restorations, root canal therapy, crown and bridge therapy, implant therapy, and replacement of missing teeth as indicated. The patient currently wishes to proceed with multiple extractions with alveoloplasty and gross debridement of the remaining dentition in the operating room with general anesthesia. The bilateral mandibular tori reductions will not be performed at this time. The operating room procedure has been scheduled for Thursday, September 14th at 7:30 AM at [facility]. If the patient is discharged, this will be changed to an outpatient procedure. The patient will then follow up with the dentist of his choice for exam, radiographs, discussion of other dental treatment needs.   2. Discussion of findings with medical team and coordination of future medical and dental care  as needed.  I spent in excess of  90 minutes during the conduct of this consultation and >50% of this time involved direct face-to-face encounter for counseling and/or coordination of the patient's care.    [name], DDS

## 2016-06-17 NOTE — Progress Notes (Signed)
Patient given diabetes education booklet, and instructed on how to watch diabetes education videos.  Will follow up with patient and answer any questions he has.

## 2016-06-17 NOTE — Care Management Note (Signed)
Case Management Note  Patient Details  Name: Steve Keller MRN: [id number] Date of Birth: 1955/11/09  Subjective/Objective:        Axillary cellulitis with molar abscess failed outpt abx treatment            Action/Plan:   Expected Discharge Date:                  Expected Discharge Plan:  Home/Self Care  In-House Referral:  NA  Discharge planning Services  NA  Post Acute Care Choice:  NA Choice offered to:  NA  DME Arranged:  N/A DME Agency:  NA  HH Arranged:  NA HH Agency:  NA  Status of Service:  In process, will continue to follow  If discussed at Long Length of Stay Meetings, dates discussed:    Additional Comments:Date:  June 17, 2016 Chart reviewed for concurrent status and case management needs. Will continue to follow the patient for status change: Discharge Planning: following for needs Expected discharge date: [id number] Steve Keller, BSN, [place], [place]  Steve Cha, RN 06/17/2016, 10:15 AM

## 2016-06-17 NOTE — Progress Notes (Signed)
PROGRESS NOTE    Steve Keller  [id number] DOB: 16-Nov-1955 DOA: 06/16/2016 PCP: No primary care provider on file.   Outpatient Specialists     Brief Narrative:  Steve Keller is a 60 y.o. male with medical/surgical history significant of appendectomy who comes into the emergency department due to dental abscess symptoms since Friday.  Per patient, he was driving back to [place] from [place], where he works, when he had sudden pain in his right maxillary area. He is states that the pain became so intense quickly, that he had to stop driving and take some over-the-counter analgesics. He is states that the pain improved on Saturday, but then worsening again, so he went to the walk-in clinic this morning for evaluation. He was prescribed amoxicillin, went home and shortly after he took the first amoxicillin tablet, he developed abdominal pain with nausea followed by 3 episodes of emesis which the patient describes as having dark red blood. He states that he has taken numerous tablets of Excedrin for pain. He denies fever, but feels chills and rigors.  He denies chest pain, palpitations, dizziness, diaphoresis, dyspnea, pitting edema of the lower extremities, PND or orthopnea.He denies melena, hematochezia, or GU symptoms.   Assessment & Plan:   Principal Problem:   Sepsis due to oral infection [facility]) Active Problems:   Hyperglycemia   Polycythemia   Hyperbilirubinemia   Tobacco use disorder   Sinus tachycardia (HCC)   Nausea and vomiting  Sepsis due to oral infection -has abcess -ask dental to see-- ? Need for drainage -WBC improved -swelling worse -unasyn  Nausea and vomiting ? hematemesis.  He has stated that he has been taking Excedrin often for this pain.  Check stool occult blood.  Continue IV hydration. Famotidine 20 mg IVP every 12 hours. Zofran 4 mg oral/IVP every 4 hours.    Hyperglycemia No symptoms Start CBG monitoring a regular insulin sliding  scale. Check hemoglobin A1c.      Polycythemia Advised to cease smoking. ? From dehydration    Tobacco use disorder Smokes about one third of a pack of cigarettes a day. Declined nicotine replacement therapy.    DVT prophylaxis:  SCD's  Code Status: Full Code   Family Communication: patient  Disposition Plan:  ?   Consultants:   Dental?-- Appreciate Dr. Enrique Sack-- not on call  Procedures:        Subjective: Worsened swelling and pain on right side of face  Objective: Vitals:   06/16/16 1816 06/16/16 1955 06/16/16 2010 06/17/16 0623  BP: (!) 159/108 (!) 155/102 115/78 (!) 146/88  Pulse: (!) 123 112 (!) 115 95  Resp: 20 18  18   Temp:   98.3 F (36.8 C) 97.9 F (36.6 C)  TempSrc:   Oral Oral  SpO2: 94% 98%  98%  Weight:      Height:        Intake/Output Summary (Last 24 hours) at 06/17/16 1107 Last data filed at 06/17/16 0548  Gross per 24 hour  Intake             2440 ml  Output                0 ml  Net             2440 ml   Filed Weights   06/16/16 1607  Weight: 109.3 kg (241 lb)    Examination:  General exam: Appears calm and comfortable  Respiratory system: Clear to auscultation. Respiratory effort normal. Cardiovascular system: S1 &  S2 heard, RRR. No JVD, murmurs, rubs, gallops or clicks. No pedal edema. Gastrointestinal system: Abdomen is nondistended, soft and nontender. No organomegaly or masses felt. Normal bowel sounds heard. Central nervous system: Alert and oriented. No focal neurological deficits. Extremities: Symmetric 5 x 5 power. Skin: swelling on right side of face Psychiatry: Judgement and insight appear normal. Mood & affect appropriate.     Data Reviewed: I have personally reviewed following labs and imaging studies  CBC:  Recent Labs Lab 06/16/16 1645 06/17/16 0509  WBC 23.3* 10.4  NEUTROABS 18.7* 6.5  HGB 18.7* 14.1  HCT 52.1* 41.3  MCV 83.8 84.5  PLT 226 0000000   Basic Metabolic Panel:  Recent  Labs Lab 06/16/16 1645 06/17/16 0509  NA 138 136  K 4.1 3.5  CL 101 102  CO2 22 25  GLUCOSE 362* 240*  BUN 13 10  CREATININE 0.94 0.64  CALCIUM 9.7 7.7*   GFR: Estimated Creatinine Clearance: 127.4 mL/min (by C-G formula based on SCr of 0.8 mg/dL). Liver Function Tests:  Recent Labs Lab 06/16/16 1645 06/17/16 0509  AST 26 16  ALT 23 19  ALKPHOS 90 59  BILITOT 1.9* 1.0  PROT 8.3* 5.7*  ALBUMIN 4.4 3.2*   No results for input(s): LIPASE, AMYLASE in the last 168 hours. No results for input(s): AMMONIA in the last 168 hours. Coagulation Profile: No results for input(s): INR, PROTIME in the last 168 hours. Cardiac Enzymes: No results for input(s): CKTOTAL, CKMB, CKMBINDEX, TROPONINI in the last 168 hours. BNP (last 3 results) No results for input(s): PROBNP in the last 8760 hours. HbA1C: No results for input(s): HGBA1C in the last 72 hours. CBG:  Recent Labs Lab 06/17/16 0030 06/17/16 0730  GLUCAP 246* 246*   Lipid Profile: No results for input(s): CHOL, HDL, LDLCALC, TRIG, CHOLHDL, LDLDIRECT in the last 72 hours. Thyroid Function Tests: No results for input(s): TSH, T4TOTAL, FREET4, T3FREE, THYROIDAB in the last 72 hours. Anemia Panel: No results for input(s): VITAMINB12, FOLATE, FERRITIN, TIBC, IRON, RETICCTPCT in the last 72 hours. Urine analysis: No results found for: COLORURINE, APPEARANCEUR, LABSPEC, PHURINE, GLUCOSEU, HGBUR, BILIRUBINUR, [place], PROTEINUR, UROBILINOGEN, NITRITE, LEUKOCYTESUR    Recent Results (from the past 240 hour(s))  Culture, blood (routine x 2)     Status: None (Preliminary result)   Collection Time: 06/16/16  7:05 PM  Result Value Ref Range Status   Specimen Description BLOOD RIGHT ARM  Final   Special Requests   Final    BOTTLES DRAWN AEROBIC AND ANAEROBIC 5ML Performed at [facility]    Culture PENDING  Incomplete   Report Status PENDING  Incomplete      Anti-infectives    Start     Dose/Rate Route Frequency  Ordered Stop   06/16/16 1900  Ampicillin-Sulbactam (UNASYN) 3 g in sodium chloride 0.9 % 100 mL IVPB     3 g 100 mL/hr over 60 Minutes Intravenous Every 6 hours 06/16/16 1837         Radiology Studies: Ct Maxillofacial W Contrast  Result Date: 06/16/2016 CLINICAL DATA:  Right cheek swelling and redness. EXAM: CT MAXILLOFACIAL WITH CONTRAST TECHNIQUE: Multidetector CT imaging of the maxillofacial structures was performed with intravenous contrast. Multiplanar CT image reconstructions were also generated. A small metallic BB was placed on the right temple in order to reliably differentiate right from left. CONTRAST:  133mL ISOVUE-300 IOPAMIDOL (ISOVUE-300) INJECTION 61% COMPARISON:  None FINDINGS: Very poor dentition with multiple upper and lower periapical lucencies. There is a  periapical lucency involving the right upper second molar. There is adjacent mucosal thickening involving the right maxillary sinus. There is a small periosteal fluid collection overlying the anterior lateral aspect of the right side of maxilla which measures 7 by 18 mm, image 39 of series 13. Overlying subcutaneous fat stranding and skin thickening is identified. Prominent right submandibular lymph node 9 mm in short axis. Right level-II node measures 1 cm, image 19 of series 3. The visualized intracranial contents are unremarkable. IMPRESSION: 1. Overall poor dentition with multiple periapical lucencies. 2. Right upper second molar periapical abscess with associated right maxillary sinus inflammation. Additionally, there is a small periosteal fluid collection/abscess associated with the right maxilla and overlying cellulitis. 3. Borderline enlarged right level 1 and 2 nodes are likely reactive. Electronically Signed   By: Kerby Moors M.D.   On: 06/16/2016 18:04   Dg Abd Acute W/chest  Result Date: 06/16/2016 CLINICAL DATA:  Vomiting EXAM: DG ABDOMEN ACUTE W/ 1V CHEST COMPARISON:  None. FINDINGS: PA view chest demonstrates  mild linear atelectasis or scarring in the left base. No acute infiltrate or consolidation. No effusion. Heart size is normal. Mild atherosclerosis of the aorta. Supine and upright views of the abdomen demonstrate no free air beneath the diaphragm. Bowel gas pattern nonspecific with relatively decreased small bowel gas. There is gas and stool present in the colon. There are no pathologic calcifications. Ovoid soft tissue opacity in the pelvis, it is felt consistent with bladder. No acute osseous abnormality. IMPRESSION: 1. Mild scarring or atelectasis in the left lung base 2. Nonspecific nonobstructed bowel gas pattern Electronically Signed   By: Donavan Foil M.D.   On: 06/16/2016 17:28        Scheduled Meds: . ampicillin-sulbactam (UNASYN) IV  3 g Intravenous Q6H  . famotidine (PEPCID) IV  20 mg Intravenous Q12H  . insulin aspart  0-15 Units Subcutaneous TID WC  . sodium chloride flush  3 mL Intravenous Q12H   Continuous Infusions: . sodium chloride 125 mL/hr at 06/17/16 0009     LOS: 1 day    Time spent: 25 min    [place], DO [facility] Pager [phone]  If 7PM-7AM, please contact night-coverage [email or web address] Password [email or web address] 06/17/2016, 11:07 AM

## 2016-06-18 ENCOUNTER — Encounter (HOSPITAL_COMMUNITY): Payer: Self-pay | Admitting: *Deleted

## 2016-06-18 ENCOUNTER — Other Ambulatory Visit (HOSPITAL_COMMUNITY): Payer: Self-pay | Admitting: Dentistry

## 2016-06-18 LAB — CBC WITH DIFFERENTIAL/PLATELET
BASOS ABS: 0.1 10*3/uL (ref 0.0–0.1)
BASOS PCT: 1 %
EOS ABS: 0.5 10*3/uL (ref 0.0–0.7)
Eosinophils Relative: 7 %
HEMATOCRIT: 41 % (ref 39.0–52.0)
HEMOGLOBIN: 13.8 g/dL (ref 13.0–17.0)
Lymphocytes Relative: 27 %
Lymphs Abs: 2.1 10*3/uL (ref 0.7–4.0)
MCH: 28.6 pg (ref 26.0–34.0)
MCHC: 33.7 g/dL (ref 30.0–36.0)
MCV: 84.9 fL (ref 78.0–100.0)
Monocytes Absolute: 0.8 10*3/uL (ref 0.1–1.0)
Monocytes Relative: 10 %
NEUTROS ABS: 4.1 10*3/uL (ref 1.7–7.7)
NEUTROS PCT: 55 %
Platelets: 174 10*3/uL (ref 150–400)
RBC: 4.83 MIL/uL (ref 4.22–5.81)
RDW: 13.6 % (ref 11.5–15.5)
WBC: 7.5 10*3/uL (ref 4.0–10.5)

## 2016-06-18 LAB — GLUCOSE, CAPILLARY
GLUCOSE-CAPILLARY: 206 mg/dL — AB (ref 65–99)
Glucose-Capillary: 239 mg/dL — ABNORMAL HIGH (ref 65–99)

## 2016-06-18 LAB — COMPREHENSIVE METABOLIC PANEL
ALBUMIN: 3.1 g/dL — AB (ref 3.5–5.0)
ALK PHOS: 57 U/L (ref 38–126)
ALT: 25 U/L (ref 17–63)
ANION GAP: 6 (ref 5–15)
AST: 29 U/L (ref 15–41)
BILIRUBIN TOTAL: 0.5 mg/dL (ref 0.3–1.2)
BUN: 7 mg/dL (ref 6–20)
CO2: 28 mmol/L (ref 22–32)
Calcium: 8.4 mg/dL — ABNORMAL LOW (ref 8.9–10.3)
Chloride: 103 mmol/L (ref 101–111)
Creatinine, Ser: 0.71 mg/dL (ref 0.61–1.24)
GFR calc Af Amer: >60 mL/min (ref >60)
GFR calc non Af Amer: >60 mL/min (ref >60)
Glucose, Bld: 192 mg/dL — ABNORMAL HIGH (ref 65–99)
POTASSIUM: 3.6 mmol/L (ref 3.5–5.1)
SODIUM: 137 mmol/L (ref 135–145)
Total Protein: 5.8 g/dL — ABNORMAL LOW (ref 6.5–8.1)

## 2016-06-18 LAB — HEMOGLOBIN A1C
HEMOGLOBIN A1C: 12.8 % — AB (ref 4.8–5.6)
Mean Plasma Glucose: 321 mg/dL

## 2016-06-18 MED ORDER — METFORMIN HCL 500 MG PO TABS: 500.0000 mg | ORAL_TABLET | Freq: Two times a day (BID) | ORAL | Status: DC

## 2016-06-18 MED ORDER — GLIMEPIRIDE 4 MG PO TABS: 4.0000 mg | ORAL_TABLET | Freq: Every day | ORAL | 0 refills | Status: DC

## 2016-06-18 MED ORDER — TRAMADOL HCL 50 MG PO TABS: 50.0000 mg | ORAL_TABLET | Freq: Four times a day (QID) | ORAL | 0 refills | Status: DC | PRN

## 2016-06-18 MED ORDER — BLOOD GLUCOSE METER KIT: PACK | 0 refills | Status: AC

## 2016-06-18 MED ORDER — AMOXICILLIN-POT CLAVULANATE 875-125 MG PO TABS
1.0000 | ORAL_TABLET | Freq: Two times a day (BID) | ORAL | Status: DC
  Administered 2016-06-18: 1 via ORAL
  Filled 2016-06-18: qty 1

## 2016-06-18 MED ORDER — METFORMIN HCL 500 MG PO TABS: 500.0000 mg | ORAL_TABLET | Freq: Two times a day (BID) | ORAL | 0 refills | Status: DC

## 2016-06-18 MED ORDER — AMOXICILLIN-POT CLAVULANATE 875-125 MG PO TABS: 1.0000 | ORAL_TABLET | Freq: Two times a day (BID) | ORAL | 0 refills | Status: DC

## 2016-06-18 MED ORDER — GLIMEPIRIDE 4 MG PO TABS
4.0000 mg | ORAL_TABLET | Freq: Every day | ORAL | Status: DC
  Filled 2016-06-18: qty 1

## 2016-06-18 NOTE — Progress Notes (Signed)
Inpatient Diabetes Program Recommendations  AACE/ADA: New Consensus Statement on Inpatient Glycemic Control (2015)  Target Ranges:  Prepandial:   less than 140 mg/dL      Peak postprandial:   less than 180 mg/dL (1-2 hours)      Critically ill patients:  140 - 180 mg/dL   Lab Results  Component Value Date   GLUCAP 239 (H) 06/18/2016   HGBA1C 12.8 (H) 06/17/2016    Review of Glycemic Control  Spoke with pt regarding HgbA1C of 12.8%. Pt states he has been drinking large amount of Gatorade recently. States he will get a PCP from his insurance co and will begin a walking program. Wants to lose weight and try to manage his DM with diet, exercise and OHAs. Discussed Glucose monitoring at home. Instructed to take blood sugar log to PCP for review and adjustment of meds, if needed. Answered questions and pt seems very motivated to make changes.  Thank you. Lorenda Peck, RD, LDN, CDE Inpatient Diabetes Coordinator [phone]

## 2016-06-18 NOTE — Progress Notes (Signed)
Date: June 18, 2016 Discharge orders checked for needs. 1-800 card for [place] providers shown to patient on the back of the insurance card. Velva Harman, RN, BSN, [place]   [phone]

## 2016-06-18 NOTE — Discharge Summary (Signed)
Physician Discharge Summary  Steve Keller NLZ:[id number] DOB: 08-Jun-1956 DOA: 06/16/2016  PCP: No primary care provider on file.  Admit date: 06/16/2016 Discharge date: 06/18/2016   Recommendations for Outpatient Follow-Up:   1. Patient to establish with PCP-- needs further blood sugar management 2. For surgery Thursday for abscess- knows to NPO after midnight Wednesday PM 3. See below   Discharge Diagnosis:   Principal Problem:   Sepsis due to oral infection ([place]) Active Problems:   Hyperglycemia   Polycythemia   Hyperbilirubinemia   Tobacco use disorder   Sinus tachycardia (HCC)   Nausea and vomiting   Discharge disposition:  Home  Discharge Condition: Improved.  Diet recommendation: Low sodium, heart healthy.  Carbohydrate-modified  Wound care: None.   History of Present Illness:   Steve Keller is a 60 y.o. male with medical/surgical history significant of appendectomy who comes into the emergency department due to dental abscess symptoms since Friday.  Per patient, he was driving back to [place] from [place], where he works, when he had sudden pain in his right maxillary area. He is states that the pain became so intense quickly, that he had to stop driving and take some over-the-counter analgesics. He is states that the pain improved on Saturday, but then worsening again, so he went to the walk-in clinic this morning for evaluation. He was prescribed amoxicillin, went home and shortly after he took the first amoxicillin tablet, he developed abdominal pain with nausea followed by 3 episodes of emesis which the patient describes as having dark red blood. He states that he has taken numerous tablets of Excedrin for pain. He denies fever, but feels chills and rigors.  He denies chest pain, palpitations, dizziness, diaphoresis, dyspnea, pitting edema of the lower extremities, PND or orthopnea.He denies melena, hematochezia, or GU symptoms.   Hospital Course by  Problem:   Sepsis due to oral infection -has abcess -for drainage on Thursday  Nausea and vomiting resolved  Hyperglycemia No symptoms hemoglobin A1c: 12-- add metformin and amaryl-- needs close outpatient follow up-- dietary compliance  Polycythemia Advised to cease smoking. ? From dehydration  Tobacco use disorder Smokes about one third of a pack of cigarettes a day. Declined nicotine replacement therapy. Encouraged cessation   Medical Consultants:    None.   Discharge Exam:   Vitals:   06/17/16 2231 06/18/16 0508  BP: 127/89 (!) 141/82  Pulse:  75  Resp:  19  Temp:  97.7 F (36.5 C)   Vitals:   06/17/16 1429 06/17/16 2141 06/17/16 2231 06/18/16 0508  BP: 104/72  127/89 (!) 141/82  Pulse: 78 83  75  Resp: '18 20  19  ' Temp: 98.4 F (36.9 C) 98.5 F (36.9 C)  97.7 F (36.5 C)  TempSrc: Oral Oral  Oral  SpO2: 98% 100%  98%  Weight:      Height:        Gen:  NAD    The results of significant diagnostics from this hospitalization (including imaging, microbiology, ancillary and laboratory) are listed below for reference.     Procedures and Diagnostic Studies:   Ct Maxillofacial W Contrast  Result Date: 06/16/2016 CLINICAL DATA:  Right cheek swelling and redness. EXAM: CT MAXILLOFACIAL WITH CONTRAST TECHNIQUE: Multidetector CT imaging of the maxillofacial structures was performed with intravenous contrast. Multiplanar CT image reconstructions were also generated. A small metallic BB was placed on the right temple in order to reliably differentiate right from left. CONTRAST:  130m ISOVUE-300 IOPAMIDOL (ISOVUE-300) INJECTION 61%  COMPARISON:  None FINDINGS: Very poor dentition with multiple upper and lower periapical lucencies. There is a periapical lucency involving the right upper second molar. There is adjacent mucosal thickening involving the right maxillary sinus. There is a small periosteal fluid collection overlying the anterior lateral  aspect of the right side of maxilla which measures 7 by 18 mm, image 39 of series 13. Overlying subcutaneous fat stranding and skin thickening is identified. Prominent right submandibular lymph node 9 mm in short axis. Right level-II node measures 1 cm, image 19 of series 3. The visualized intracranial contents are unremarkable. IMPRESSION: 1. Overall poor dentition with multiple periapical lucencies. 2. Right upper second molar periapical abscess with associated right maxillary sinus inflammation. Additionally, there is a small periosteal fluid collection/abscess associated with the right maxilla and overlying cellulitis. 3. Borderline enlarged right level 1 and 2 nodes are likely reactive. Electronically Signed   By: Kerby Moors M.D.   On: 06/16/2016 18:04   Dg Abd Acute W/chest  Result Date: 06/16/2016 CLINICAL DATA:  Vomiting EXAM: DG ABDOMEN ACUTE W/ 1V CHEST COMPARISON:  None. FINDINGS: PA view chest demonstrates mild linear atelectasis or scarring in the left base. No acute infiltrate or consolidation. No effusion. Heart size is normal. Mild atherosclerosis of the aorta. Supine and upright views of the abdomen demonstrate no free air beneath the diaphragm. Bowel gas pattern nonspecific with relatively decreased small bowel gas. There is gas and stool present in the colon. There are no pathologic calcifications. Ovoid soft tissue opacity in the pelvis, it is felt consistent with bladder. No acute osseous abnormality. IMPRESSION: 1. Mild scarring or atelectasis in the left lung base 2. Nonspecific nonobstructed bowel gas pattern Electronically Signed   By: Donavan Foil M.D.   On: 06/16/2016 17:28     Labs:   Basic Metabolic Panel:  Recent Labs Lab 06/16/16 1645 06/17/16 0509 06/18/16 0549  NA 138 136 137  K 4.1 3.5 3.6  CL 101 102 103  CO2 '22 25 28  ' GLUCOSE 362* 240* 192*  BUN '13 10 7  ' CREATININE 0.94 0.64 0.71  CALCIUM 9.7 7.7* 8.4*   GFR Estimated Creatinine Clearance: 127.4  mL/min (by C-G formula based on SCr of 0.8 mg/dL). Liver Function Tests:  Recent Labs Lab 06/16/16 1645 06/17/16 0509 06/18/16 0549  AST '26 16 29  ' ALT '23 19 25  ' ALKPHOS 90 59 57  BILITOT 1.9* 1.0 0.5  PROT 8.3* 5.7* 5.8*  ALBUMIN 4.4 3.2* 3.1*   No results for input(s): LIPASE, AMYLASE in the last 168 hours. No results for input(s): AMMONIA in the last 168 hours. Coagulation profile No results for input(s): INR, PROTIME in the last 168 hours.  CBC:  Recent Labs Lab 06/16/16 1645 06/17/16 0509 06/18/16 0549  WBC 23.3* 10.4 7.5  NEUTROABS 18.7* 6.5 4.1  HGB 18.7* 14.1 13.8  HCT 52.1* 41.3 41.0  MCV 83.8 84.5 84.9  PLT 226 175 174   Cardiac Enzymes: No results for input(s): CKTOTAL, CKMB, CKMBINDEX, TROPONINI in the last 168 hours. BNP: Invalid input(s): POCBNP CBG:  Recent Labs Lab 06/17/16 1210 06/17/16 1701 06/17/16 2135 06/18/16 0757 06/18/16 1203  GLUCAP 223* 216* 216* 206* 239*   D-Dimer No results for input(s): DDIMER in the last 72 hours. Hgb A1c  Recent Labs  06/17/16 0509  HGBA1C 12.8*   Lipid Profile No results for input(s): CHOL, HDL, LDLCALC, TRIG, CHOLHDL, LDLDIRECT in the last 72 hours. Thyroid function studies No results for input(s): TSH, T4TOTAL, T3FREE,  THYROIDAB in the last 72 hours.  Invalid input(s): FREET3 Anemia work up No results for input(s): VITAMINB12, FOLATE, FERRITIN, TIBC, IRON, RETICCTPCT in the last 72 hours. Microbiology Recent Results (from the past 240 hour(s))  Culture, blood (routine x 2)     Status: None (Preliminary result)   Collection Time: 06/16/16  7:05 PM  Result Value Ref Range Status   Specimen Description BLOOD RIGHT ARM  Final   Special Requests   Final    BOTTLES DRAWN AEROBIC AND ANAEROBIC 5ML Performed at [facility]    Culture PENDING  Incomplete   Report Status PENDING  Incomplete     Discharge Instructions:   Discharge Instructions    Diet - low sodium heart healthy     Complete by:  As directed   Diet Carb Modified    Complete by:  As directed   Discharge instructions    Complete by:  As directed   Will need further titration of diabetic medications PCP will also need to consider addition of ACE inhibitor for BP control Check blood sugars in AM while fasting and occasionally 2 hours after a meal--- bring log of these numbers to PCP Procedure planned for Thursday AM with Dr. Lawana Chambers   Increase activity slowly    Complete by:  As directed       Medication List    TAKE these medications   acetaminophen 500 MG tablet Commonly known as:  TYLENOL Take 1,000 mg by mouth every 6 (six) hours as needed for mild pain, moderate pain or headache.   amoxicillin-clavulanate 875-125 MG tablet Commonly known as:  AUGMENTIN Take 1 tablet by mouth every 12 (twelve) hours.   blood glucose meter kit and supplies Dispense based on patient and insurance preference. Use up to four times daily as directed. (FOR ICD-9 250.00, 250.01).   glimepiride 4 MG tablet Commonly known as:  AMARYL Take 1 tablet (4 mg total) by mouth daily with breakfast.   metFORMIN 500 MG tablet Commonly known as:  GLUCOPHAGE Take 1 tablet (500 mg total) by mouth 2 (two) times daily with a meal.   traMADol 50 MG tablet Commonly known as:  ULTRAM Take 1 tablet (50 mg total) by mouth every 6 (six) hours as needed for moderate pain.         Time coordinating discharge: 35 min  Signed:  Gracelin Weisberg U Bhavesh Vazquez   [facility] 06/18/2016, 12:38 PM

## 2016-06-19 ENCOUNTER — Encounter (HOSPITAL_COMMUNITY): Payer: Self-pay | Admitting: *Deleted

## 2016-06-19 ENCOUNTER — Ambulatory Visit (HOSPITAL_COMMUNITY): Payer: Managed Care, Other (non HMO) | Admitting: Registered Nurse

## 2016-06-19 ENCOUNTER — Ambulatory Visit (HOSPITAL_COMMUNITY)
Admission: RE | Admit: 2016-06-19 | Discharge: 2016-06-19 | Disposition: A | Discharge status: Home / Self Care | Payer: Managed Care, Other (non HMO) | Source: Ambulatory Visit | Attending: Dentistry | Admitting: Dentistry

## 2016-06-19 ENCOUNTER — Encounter (HOSPITAL_COMMUNITY)
Admission: RE | Disposition: A | Discharge status: Home / Self Care | Payer: Self-pay | Source: Ambulatory Visit | Attending: Dentistry

## 2016-06-19 DIAGNOSIS — F17210 Nicotine dependence, cigarettes, uncomplicated: Secondary | ICD-10-CM | POA: Insufficient documentation

## 2016-06-19 DIAGNOSIS — K036 Deposits [accretions] on teeth: Secondary | ICD-10-CM

## 2016-06-19 DIAGNOSIS — K045 Chronic apical periodontitis: Secondary | ICD-10-CM | POA: Insufficient documentation

## 2016-06-19 DIAGNOSIS — K0530 Chronic periodontitis, unspecified: Secondary | ICD-10-CM

## 2016-06-19 DIAGNOSIS — E119 Type 2 diabetes mellitus without complications: Secondary | ICD-10-CM | POA: Diagnosis not present

## 2016-06-19 DIAGNOSIS — K047 Periapical abscess without sinus: Secondary | ICD-10-CM

## 2016-06-19 DIAGNOSIS — R220 Localized swelling, mass and lump, head: Secondary | ICD-10-CM | POA: Diagnosis present

## 2016-06-19 DIAGNOSIS — K029 Dental caries, unspecified: Secondary | ICD-10-CM

## 2016-06-19 DIAGNOSIS — K083 Retained dental root: Secondary | ICD-10-CM

## 2016-06-19 HISTORY — DX: Type 2 diabetes mellitus without complications: E11.9

## 2016-06-19 HISTORY — PX: MULTIPLE EXTRACTIONS WITH ALVEOLOPLASTY: SHX5342

## 2016-06-19 LAB — GLUCOSE, CAPILLARY
GLUCOSE-CAPILLARY: 238 mg/dL — AB (ref 65–99)
Glucose-Capillary: 241 mg/dL — ABNORMAL HIGH (ref 65–99)
Glucose-Capillary: 253 mg/dL — ABNORMAL HIGH (ref 65–99)

## 2016-06-19 SURGERY — MULTIPLE EXTRACTION WITH ALVEOLOPLASTY
Anesthesia: General

## 2016-06-19 MED ORDER — HYDROMORPHONE HCL 1 MG/ML IJ SOLN
INTRAMUSCULAR | Status: AC
  Filled 2016-06-19: qty 1

## 2016-06-19 MED ORDER — CEFAZOLIN SODIUM-DEXTROSE 2-4 GM/100ML-% IV SOLN
2.0000 g | Freq: Once | INTRAVENOUS | Status: AC
  Administered 2016-06-19: 2 g via INTRAVENOUS

## 2016-06-19 MED ORDER — HYDROCODONE-ACETAMINOPHEN 5-325 MG PO TABS
1.0000 | ORAL_TABLET | Freq: Four times a day (QID) | ORAL | Status: DC | PRN
  Administered 2016-06-19: 1 via ORAL
  Filled 2016-06-19: qty 1

## 2016-06-19 MED ORDER — SUGAMMADEX SODIUM 200 MG/2ML IV SOLN
INTRAVENOUS | Status: DC | PRN
  Administered 2016-06-19: 200 mg via INTRAVENOUS

## 2016-06-19 MED ORDER — LABETALOL HCL 5 MG/ML IV SOLN
5.0000 mg | Freq: Once | INTRAVENOUS | Status: AC
  Administered 2016-06-19: 5 mg via INTRAVENOUS

## 2016-06-19 MED ORDER — FENTANYL CITRATE (PF) 100 MCG/2ML IJ SOLN
INTRAMUSCULAR | Status: AC
  Filled 2016-06-19: qty 2

## 2016-06-19 MED ORDER — 0.9 % SODIUM CHLORIDE (POUR BTL) OPTIME
TOPICAL | Status: DC | PRN
  Administered 2016-06-19: 1000 mL

## 2016-06-19 MED ORDER — BUPIVACAINE-EPINEPHRINE (PF) 0.5% -1:200000 IJ SOLN
INTRAMUSCULAR | Status: DC | PRN
  Administered 2016-06-19: 3.6 mL

## 2016-06-19 MED ORDER — ISOPROPYL ALCOHOL 70 % SOLN
Status: DC | PRN
  Administered 2016-06-19: 1 via TOPICAL

## 2016-06-19 MED ORDER — OXYMETAZOLINE HCL 0.05 % NA SOLN
NASAL | Status: DC | PRN
  Administered 2016-06-19 (×3): 2 via NASAL

## 2016-06-19 MED ORDER — ROCURONIUM BROMIDE 10 MG/ML (PF) SYRINGE
PREFILLED_SYRINGE | INTRAVENOUS | Status: DC | PRN
  Administered 2016-06-19: 50 mg via INTRAVENOUS

## 2016-06-19 MED ORDER — HYDROMORPHONE HCL 1 MG/ML IJ SOLN
0.2500 mg | INTRAMUSCULAR | Status: DC | PRN
  Administered 2016-06-19 (×4): 0.5 mg via INTRAVENOUS

## 2016-06-19 MED ORDER — MEPERIDINE HCL 50 MG/ML IJ SOLN: 6.2500 mg | INTRAMUSCULAR | Status: DC | PRN

## 2016-06-19 MED ORDER — CEFAZOLIN SODIUM-DEXTROSE 2-4 GM/100ML-% IV SOLN
INTRAVENOUS | Status: AC
  Filled 2016-06-19: qty 100

## 2016-06-19 MED ORDER — MIDAZOLAM HCL 2 MG/2ML IJ SOLN
INTRAMUSCULAR | Status: AC
  Filled 2016-06-19: qty 2

## 2016-06-19 MED ORDER — LIDOCAINE-EPINEPHRINE 2 %-1:100000 IJ SOLN
INTRAMUSCULAR | Status: DC | PRN
  Administered 2016-06-19: 6.8 mL

## 2016-06-19 MED ORDER — DEXAMETHASONE SODIUM PHOSPHATE 10 MG/ML IJ SOLN
INTRAMUSCULAR | Status: AC
  Filled 2016-06-19: qty 1

## 2016-06-19 MED ORDER — ONDANSETRON HCL 4 MG/2ML IJ SOLN
INTRAMUSCULAR | Status: AC
  Filled 2016-06-19: qty 2

## 2016-06-19 MED ORDER — HYDROCODONE-ACETAMINOPHEN 5-325 MG PO TABS: 1.0000 | ORAL_TABLET | Freq: Four times a day (QID) | ORAL | 0 refills | Status: DC | PRN

## 2016-06-19 MED ORDER — LIDOCAINE 2% (20 MG/ML) 5 ML SYRINGE
INTRAMUSCULAR | Status: AC
  Filled 2016-06-19: qty 5

## 2016-06-19 MED ORDER — LIDOCAINE-EPINEPHRINE 2 %-1:100000 IJ SOLN
INTRAMUSCULAR | Status: AC
  Filled 2016-06-19: qty 10.2

## 2016-06-19 MED ORDER — PROPOFOL 10 MG/ML IV BOLUS
INTRAVENOUS | Status: DC | PRN
  Administered 2016-06-19: 150 mg via INTRAVENOUS

## 2016-06-19 MED ORDER — LACTATED RINGERS IV SOLN: INTRAVENOUS | Status: DC

## 2016-06-19 MED ORDER — MIDAZOLAM HCL 5 MG/5ML IJ SOLN
INTRAMUSCULAR | Status: DC | PRN
  Administered 2016-06-19: 2 mg via INTRAVENOUS

## 2016-06-19 MED ORDER — ONDANSETRON HCL 4 MG/2ML IJ SOLN
INTRAMUSCULAR | Status: DC | PRN
  Administered 2016-06-19: 4 mg via INTRAVENOUS

## 2016-06-19 MED ORDER — BUPIVACAINE-EPINEPHRINE (PF) 0.5% -1:200000 IJ SOLN
INTRAMUSCULAR | Status: AC
  Filled 2016-06-19: qty 3.6

## 2016-06-19 MED ORDER — LACTATED RINGERS IV SOLN
INTRAVENOUS | Status: DC | PRN
  Administered 2016-06-19: 08:00:00 via INTRAVENOUS

## 2016-06-19 MED ORDER — HYDROMORPHONE HCL 1 MG/ML IJ SOLN
INTRAMUSCULAR | Status: DC
Start: 2016-06-19 — End: 2016-06-19
  Filled 2016-06-19: qty 1

## 2016-06-19 MED ORDER — LACTATED RINGERS IV SOLN
INTRAVENOUS | Status: DC
  Administered 2016-06-19: 1000 mL via INTRAVENOUS

## 2016-06-19 MED ORDER — ROCURONIUM BROMIDE 100 MG/10ML IV SOLN
INTRAVENOUS | Status: AC
  Filled 2016-06-19: qty 1

## 2016-06-19 MED ORDER — LABETALOL HCL 5 MG/ML IV SOLN
INTRAVENOUS | Status: AC
  Filled 2016-06-19: qty 4

## 2016-06-19 MED ORDER — PROPOFOL 10 MG/ML IV BOLUS
INTRAVENOUS | Status: AC
  Filled 2016-06-19: qty 20

## 2016-06-19 MED ORDER — OXYMETAZOLINE HCL 0.05 % NA SOLN
NASAL | Status: AC
  Filled 2016-06-19: qty 15

## 2016-06-19 MED ORDER — FENTANYL CITRATE (PF) 100 MCG/2ML IJ SOLN
INTRAMUSCULAR | Status: DC | PRN
  Administered 2016-06-19: 100 ug via INTRAVENOUS

## 2016-06-19 MED ORDER — LIDOCAINE HCL (CARDIAC) 20 MG/ML IV SOLN
INTRAVENOUS | Status: DC | PRN
  Administered 2016-06-19: 100 mg via INTRAVENOUS

## 2016-06-19 MED ORDER — ISOPROPYL ALCOHOL 70 % SOLN
Status: AC
  Filled 2016-06-19: qty 480

## 2016-06-19 MED ORDER — ONDANSETRON HCL 4 MG/2ML IJ SOLN: 4.0000 mg | Freq: Once | INTRAMUSCULAR | Status: DC | PRN

## 2016-06-19 SURGICAL SUPPLY — 32 items
ATTRACTOMAT 16X20 MAGNETIC DRP (DRAPES) ×3 IMPLANT
BAG ZIPLOCK 12X15 (MISCELLANEOUS) IMPLANT
BANDAGE EYE OVAL (MISCELLANEOUS) IMPLANT
BLADE SURG 15 STRL LF DISP TIS (BLADE) ×2 IMPLANT
BLADE SURG 15 STRL SS (BLADE) ×4
CANNULA VESSEL W/WING WO/VALVE (CANNULA) ×3 IMPLANT
GAUZE SPONGE 4X4 12PLY STRL (GAUZE/BANDAGES/DRESSINGS) IMPLANT
GAUZE SPONGE 4X4 16PLY XRAY LF (GAUZE/BANDAGES/DRESSINGS) ×3 IMPLANT
GLOVE BIOGEL PI IND STRL 6 (GLOVE) ×1 IMPLANT
GLOVE BIOGEL PI INDICATOR 6 (GLOVE) ×2
GLOVE SURG ORTHO 8.0 STRL STRW (GLOVE) ×3 IMPLANT
GLOVE SURG SS PI 6.0 STRL IVOR (GLOVE) ×3 IMPLANT
GOWN STRL REUS W/TWL 2XL LVL3 (GOWN DISPOSABLE) ×3 IMPLANT
GOWN STRL REUS W/TWL LRG LVL3 (GOWN DISPOSABLE) ×3 IMPLANT
KIT BASIN OR (CUSTOM PROCEDURE TRAY) ×3 IMPLANT
NEEDLE DENTAL RB 25GX1.25 (NEEDLE) IMPLANT
NS IRRIG 1000ML POUR BTL (IV SOLUTION) ×3 IMPLANT
PACK EENT SPLIT (PACKS) ×3 IMPLANT
PACKING VAGINAL (PACKING) ×3 IMPLANT
SPONGE SURGIFOAM ABS GEL 100 (HEMOSTASIS) ×3 IMPLANT
SUCTION FRAZIER HANDLE 12FR (TUBING) ×2
SUCTION TUBE FRAZIER 12FR DISP (TUBING) ×1 IMPLANT
SUT CHROMIC 3 0 PS 2 (SUTURE) ×9 IMPLANT
SUT CHROMIC 3 0 SH 27 (SUTURE) IMPLANT
SUT CHROMIC 4 0 P 3 18 (SUTURE) IMPLANT
SYR 50ML LL SCALE MARK (SYRINGE) ×3 IMPLANT
TOWEL OR 17X26 10 PK STRL BLUE (TOWEL DISPOSABLE) ×3 IMPLANT
TUBING CONNECTING 10 (TUBING) ×2 IMPLANT
TUBING CONNECTING 10' (TUBING) ×1
WATER STERILE IRR 1000ML POUR (IV SOLUTION) ×6 IMPLANT
WATER STERILE IRR 1500ML POUR (IV SOLUTION) IMPLANT
YANKAUER SUCT BULB TIP NO VENT (SUCTIONS) ×3 IMPLANT

## 2016-06-19 NOTE — Op Note (Signed)
OPERATIVE REPORT  Patient:            Steve Keller Date of Birth:  11-09-1955 MRN:                [id number]   DATE OF PROCEDURE:  06/19/2016  PREOPERATIVE DIAGNOSES: 1. Right facial swelling 2. Periapical abscess 3. Chronic apical periodontitis 4. Multiple retained root segments 5. Dental caries 6. Chronic periodontitis 7. Accretions   POSTOPERATIVE DIAGNOSES: 1. Right facial swelling 2. Periapical abscess 3. Chronic apical periodontitis 4. Multiple retained root segments 5. Dental caries 6. Chronic periodontitis 7. Accretions   OPERATIONS: 1. Multiple extraction of tooth numbers 2, 4, 5, 13, 14, 15, 20, 21, and 30 2. 3 Quadrants of alveoloplasty 3. Gross debridement of remaining dentition   SURGEON: [name], DDS  ASSISTANT: Camie Patience, (dental assistant)  ANESTHESIA: General anesthesia via nasoendotracheal tube.  MEDICATIONS: 1. Ancef 2 g IV prior to invasive dental procedures. 2. Local anesthesia with a total utilization of 4 carpules each containing 34 mg of lidocaine with 0.017 mg of epinephrine as well as 2 carpules each containing 9 mg of bupivacaine with 0.009 mg of epinephrine.  SPECIMENS: There are 9 teeth that were discarded.  DRAINS: None  CULTURES: None  COMPLICATIONS: None   ESTIMATED BLOOD LOSS: 100 mLs.  INTRAVENOUS FLUIDS: 1200 mLs of Lactated ringers solution.  INDICATIONS: The patient was recently diagnosed with right facial swelling.  A dental consultation was then requested to evaluate patient for dental etiology and provide treatment as indicated.  The patient was examined and treatment planned for multiple extractions with alveoloplasty and gross debridement of remaining dentition.  This treatment plan was formulated to decrease the risks and complications associated with dental infection from further affecting the patient's systemic health.  OPERATIVE FINDINGS: Patient was examined in operating room number 4.  The  teeth were identified for extraction. The patient was noted be affected by right facial swelling, periapical abscess area #4/5, chronic apical periodontitis, retained root segments, dental caries, chronic periodontitis, and accretions.  DESCRIPTION OF PROCEDURE: Patient was brought to the main operating room number 4. Patient was then placed in the supine position on the operating table. General anesthesia was then induced per the anesthesia team. The patient was then prepped and draped in the usual manner for a dental medicine procedure. A timeout was performed. The patient was identified and procedures were verified. A throat pack was placed at this time. The oral cavity was then thoroughly examined with the findings noted above. The patient was then ready for dental medicine procedure as follows:  Local anesthesia was then administered sequentially with a total utilization of 4 carpules each containing 34 mg of lidocaine with 0.017 mg of epinephrine as well as 2 carpules  each containing 9 mg bupivacaine with 0.009 mg of epinephrine.  The Maxillary left and right quadrants first approached. Anesthesia was then delivered utilizing infiltration with lidocaine with epinephrine. A #15 blade incision was then made from the maxillary right tuberosity and extended to the mesial of #6.  A  surgical flap was then carefully reflected. Tooth numbers 2, 4, and 5 are then subluxated with a series straight elevators. Tooth #2 was then removed with a 53R forceps without complications. Tooth numbers 4 and 5 are then removed with a 150 forceps without complications. Alveoloplasty was then performed utilizing a rongeur and bone file in the area of tooth numbers 1 through 5. The tissues were then approximated and trimmed appropriately. The surgical  site was then irrigated with copious amounts of sterile saline. A piece of Surgifoam was placed in the extraction sockets appropriately. The surgical site was then closed from the  maxillary right tuberosity and extended to the distal of #6 utilizing 3-0 chromic gut suture in a continuous interrupted suture technique 1.  The maxillary left surgical site was then approached. A 15 blade incision was then made from the maxillary left tuberosity and extended to the mesial of #12. A surgical flap was then carefully reflected. Retained roots numbers 13, 14, 15 were then removed utilizing a 150 forceps and rongeurs as needed. Alveoloplasty was then performed utilizing a rongeurs and bone file. The tissues were approximated and trimmed appropriately. The surgical site was then irrigated with copious amounts sterile saline. A piece of Surgifoam was placed in the extraction sockets as needed. Surgical site was then closed from the maxillary left tuberosity and extended to the distal of #12 utilizing 3-0 chromic gut suture in a continuous interrupted suture technique 1.  At this point time, the mandibular quadrants were approached. The patient was given bilateral inferior alveolar nerve blocks and long buccal nerve blocks utilizing the bupivacaine with epinephrine. Further infiltration was then achieved utilizing the lidocaine with epinephrine. A 15 blade incision was then made from the distal of number 19 and extended to the mesial of #22. A surgical flap was then carefully reflected. Tooth numbers 20 and 21 were then subluxated with a series of straight elevators and removed with a 151 forceps without complications. Alveoloplasty was then performed utilizing a rongeur and bone file to help achieve primary closure. Surgical site was irrigated with copious amounts sterile saline. The tissues were approximated and trimmed appropriately. A piece of Surgifoam was placed in the extraction sockets numbers 20 and 21. The surgical site was then closed from the mesial of #18 and extended to the distal of #22 utilizing 3-0 chromic gut suture in a continuous interrupted suture technique 1.  Tooth #30 was  then approached. A 15 blade incision was made on the buccal aspect from the mesial of #31 and extended to the distal of #29. A buccal flap was then reflected. Tooth #30 was then subluxated with a series of straight elevators. The coronal aspect of the tooth was then removed with a 151 forceps leaving the roots remaining. The roots were then sectioned from the buccal to the lingual utilizing a surgical handpiece and bur and copious amounts sterile water. The retained roots were then subluxated and removed with a series of cryers elevators without complication. The socket was then curetted and compressed appropriately. Minor alveoloplasty was then performed on the buccal aspect. The surgical site was then irrigated with copious amounts sterile saline. A piece of Surgifoam was then placed in the extraction socket. The surgical site was then closed from the mesial of #31 and extended to the distal of #29 utilizing 3-0 chromic gut suture in a continuous interrupted suture technique 1.   The remaining dentition was then approached. A sonic scaler was used to remove accretions. A series of hand curettes were then used to further refine removal of accretions. A sonic scaler was then again used to further refine removal of accretions. This completed the full mouth debridement procedure.  At this point time, the entire mouth was irrigated with copious amounts of sterile saline. The patient was examined for complications, seeing none, the dental medicine procedure was deemed to be complete. The throat pack was removed at this time. An oral airway  was then placed at the request of the anesthesia team. A series of 4 x 4 gauze were placed in the mouth to aid hemostasis. The patient was then handed over to the anesthesia team for final disposition. After an appropriate amount of time, the patient was extubated and taken to the postanesthsia care unit in good condition. All counts were correct for the dental medicine  procedure. The patient is to continue the Augmentin antibiotic therapy as prescribed by the hospitalist at his recent discharge. Patient is to use the [facility] for pain medication. Patient is to take one to 2 tablets every 6 hours as needed for moderate to severe pain. Patient is to return to clinic in 7-10 days for evaluation of healing and suture removal. Patient is aware that sutures will dissolve on their own, however.   [name], DDS.

## 2016-06-19 NOTE — Anesthesia Postprocedure Evaluation (Signed)
Anesthesia Post Note  Patient: Steve Keller  Procedure(s) Performed: Procedure(s) (LRB): EXTRACTION OF TOOTH #'S 2,4,5,13,14,15,20,21,AND 30 WITH ALVEOLOPLASTY AND GROSS DEBRIDEMENT OF REMAINING TEETH (N/A)  Patient location during evaluation: PACU Anesthesia Type: General Level of consciousness: awake and alert Pain management: pain level controlled Vital Signs Assessment: post-procedure vital signs reviewed and stable Respiratory status: spontaneous breathing, nonlabored ventilation, respiratory function stable and patient connected to nasal cannula oxygen Cardiovascular status: blood pressure returned to baseline and stable Postop Assessment: no signs of nausea or vomiting Anesthetic complications: no    Last Vitals:  Vitals:   06/19/16 1208 06/19/16 1305  BP: (!) 180/95 (!) 179/97  Pulse: 92 93  Resp:    Temp: 36.6 C (!) 35.9 C    Last Pain:  Vitals:   06/19/16 1305  TempSrc: Axillary  PainSc: 6                  Kaylenn Civil DAVID

## 2016-06-19 NOTE — Progress Notes (Signed)
PRE-OPERATIVE NOTE:  06/19/2016 Steve Keller [id number]  VITALS: BP (!) 142/87   Pulse 92   Temp 98.8 F (37.1 C) (Oral)   Resp 18   Ht 6\' 1"  (1.854 m)   Wt 242 lb (109.8 kg)   SpO2 98%   BMI 31.93 kg/m   Lab Results  Component Value Date   WBC 7.5 06/18/2016   HGB 13.8 06/18/2016   HCT 41.0 06/18/2016   MCV 84.9 06/18/2016   PLT 174 06/18/2016   BMET    Component Value Date/Time   NA 137 06/18/2016 0549   K 3.6 06/18/2016 0549   CL 103 06/18/2016 0549   CO2 28 06/18/2016 0549   GLUCOSE 192 (H) 06/18/2016 0549   BUN 7 06/18/2016 0549   CREATININE 0.71 06/18/2016 0549   CALCIUM 8.4 (L) 06/18/2016 0549   GFRNONAA >60 06/18/2016 0549   GFRAA >60 06/18/2016 0549    No results found for: INR, PROTIME No results found for: PTT   Steve Keller presents for mMultiple dental extractions with alveoloplasty and gross debridement of remaining dentition in the operating room with general anesthesia.    SUBJECTIVE: The patient denies any acute medical or dental changes and agrees to proceed with treatment as planned.  EXAM: No sign of acute dental changes. Facial swelling appears to have improved.  ASSESSMENT: Patient is affected by chronic apical periodontitis, periapical abscess, dental caries, chronic periodontitis, accretions, retained root segments.  PLAN: Patient agrees to proceed with treatment as planned in the operating room as previously discussed and accepts the risks, benefits, and complications of the proposed treatment. Patient is aware of the risk for bleeding, bruising, swelling, infection, pain, nerve damage, soft tissue damage, damage to adjacent teeth, sinus involvement, root tip fracture, mandible fracture, and the risks of complications associated with the anesthesia. Patient also is aware of the potential for other complications not mentioned above.   [name], DDS

## 2016-06-19 NOTE — Progress Notes (Signed)
Patient is sitting on side of bed talking, drinking room temperature diet gingerale, and very animated. Ice packs are off and gauze removed from mouth after oral surgery by Dr Enrique Sack. Encouraged to lie back with HOB elevated by 60 degrees. Medicated for pain. Ice packs reapplied and to bite down on gauze until RN returns to reassess pain.

## 2016-06-19 NOTE — Discharge Instructions (Signed)
General Anesthesia, Adult, Care After Refer to this sheet in the next few weeks. These instructions provide you with information on caring for yourself after your procedure. Your health care provider may also give you more specific instructions. Your treatment has been planned according to current medical practices, but problems sometimes occur. Call your health care provider if you have any problems or questions after your procedure. WHAT TO EXPECT AFTER THE PROCEDURE After the procedure, it is typical to experience:  Sleepiness.  Nausea and vomiting. HOME CARE INSTRUCTIONS  For the first 24 hours after general anesthesia:  Have a responsible person with you.  Do not drive a car. If you are alone, do not take public transportation.  Do not drink alcohol.  Do not take medicine that has not been prescribed by your health care provider.  Do not sign important papers or make important decisions.  You may resume a normal diet and activities as directed by your health care provider.  Change bandages (dressings) as directed.  If you have questions or problems that seem related to general anesthesia, call the hospital and ask for the anesthetist or anesthesiologist on call. SEEK MEDICAL CARE IF:  You have nausea and vomiting that continue the day after anesthesia.  You develop a rash. SEEK IMMEDIATE MEDICAL CARE IF:   You have difficulty breathing.  You have chest pain.  You have any allergic problems.   This information is not intended to replace advice given to you by your health care provider. Make sure you discuss any questions you have with your health care provider.   Document Released: 12/30/2000 Document Revised: 10/14/2014 Document Reviewed: 01/22/2012 Elsevier Interactive Patient Education 2016 [place]  FACTS:  Ice used in ice bag helps keep the swelling down, and can help lessen the pain.  It is easier to treat pain BEFORE  it happens.  Spitting disturbs the clot and may cause bleeding to start again, or to get worse.  Smoking delays healing and can cause complications.  Sharing prescriptions can be dangerous.  Do not take medications not recently prescribed for you.  Antibiotics may stop birth control pills from working.  Use other means of birth control while on antibiotics.  Warm salt water rinses after the first 24 hours will help lessen the swelling:  Use 1/2 teaspoonful of table salt per oz.of water.  DO NOT:  Do not spit.  Do not drink through a straw.  Strongly advised not to smoke, dip snuff or chew tobacco at least for 3 days.  Do not eat sharp or crunchy foods.  Avoid the area of surgery when chewing.  Do not stop your antibiotics before your instructions say to do so.  Do not eat hot foods until bleeding has stopped.  If you need to, let your food cool down to room temperature.  EXPECT:  Some swelling, especially first 2-3 days.  Soreness or discomfort in varying degrees.  Follow your dentist's instructions about how to handle pain before it starts.  Pinkish saliva or light blood in saliva, or on your pillow in the morning.  This can last around 24 hours.  Bruising inside or outside the mouth.  This may not show up until 2-3 days after surgery.  Don't worry, it will go away in time.  Pieces of "bone" may work themselves loose.  It's OK.  If they bother you, let us know.  WHAT TO DO IMMEDIATELY AFTER SURGERY:  Bite  on the gauze with steady pressure for 1-2 hours.  Don't chew on the gauze.  Do not lie down flat.  Raise your head support especially for the first 24 hours.  Apply ice to your face on the side of the surgery.  You may apply it 20 minutes on and a few minutes off.  Ice for 8-12 hours.  You may use ice up to 24 hours.  Before the numbness wears off, take a pain pill as instructed.  Prescription pain medication is not always required.  SWELLING:  Expect swelling for  the first couple of days.  It should get better after that.  If swelling increases 3 days or so after surgery; let us know as soon as possible.  FEVER:  Take Tylenol every 4 hours if needed to lower your temperature, especially if it is at 100F or higher.  Drink lots of fluids.  If the fever does not go away, let us know.  BREATHING TROUBLE:  Any unusual difficulty breathing means you have to have someone bring you to the emergency room ASAP  BLEEDING:  Light oozing is expected for 24 hours or so.  Prop head up with pillows  Avoid spitting  Do not confuse bright red fresh flowing blood with lots of saliva colored with a little bit of blood.  If you notice some bleeding, place gauze or a tea bag where it is bleeding and apply CONSTANT pressure by biting down for 1 hour.  Avoid talking during this time.  Do not remove the gauze or tea bag during this hour to "check" the bleeding.  If you notice bright RED bleeding FLOWING out of particular area, and filling the floor of your mouth, put a wad of gauze on that area, bite down firmly and constantly.  Call us immediately.  If we're closed, have someone bring you to the emergency room.  ORAL HYGIENE:  Brush your teeth as usual after meals and before bedtime.  Use a soft toothbrush around the area of surgery.  DO NOT AVOID BRUSHING.  Otherwise bacteria(germs) will grow and may delay healing or encourage infection.  Since you cannot spit, just gently rinse and let the water flow out of your mouth.  DO NOT SWISH HARD.  EATING:  Cool liquids are a good point to start.  Increase to soft foods as tolerated.  PRESCRIPTIONS:  Follow the directions for your prescriptions exactly as written.  If Dr. Enrique Sack gave you a narcotic pain medication, do not drive, operate machinery or drink alcohol when on that medication.  QUESTIONS:  Call our office during office hours [phone] or call the Emergency Room at [phone].

## 2016-06-19 NOTE — H&P (Signed)
06/19/2016  Patient:            Steve Keller Date of Birth:  05/28/1956 MRN:                [id number]   BP (!) 142/87   Pulse 92   Temp 98.8 F (37.1 C) (Oral)   Resp 18   Ht 6\' 1"  (1.854 m)   Wt 242 lb (109.8 kg)   SpO2 98%   BMI 31.93 kg/m   Steve Keller presents for multiple dental extractions with alveoloplasty and gross debridement of the remaining dentition in the operating room with general anesthesia. Patient with resolution of right facial swelling since last seen. Patient denies any other medical or dental changes. Please see H&P from Dr. Olevia Bowens dated 06/16/16 to act as H and P for the Dental OR procedure. Dr. Enrique Sack     History and Physical    Steve Keller [id number] DOB: [birth year] DOA: 06/16/2016  PCP: No primary care provider on file.   Patient coming from: Home.  Chief Complaint: Dental abscess.  HPI: Steve Keller is a 60 y.o. male with medical/surgical history significant of appendectomy who comes into the emergency department due to dental abscess symptoms since Friday.  Per patient, he was driving back to [place] from [place], where he works, when he had sudden pain in his right maxillary area. He is states that the pain became so intense quickly, that he had to stop driving and take some over-the-counter analgesics. He is states that the pain improved on Saturday, but then worsening again, so he went to the walk-in clinic this morning for evaluation. He was prescribed amoxicillin, went home and shortly after he took the first amoxicillin tablet, he developed abdominal pain with nausea followed by 3 episodes of emesis which the patient describes as having dark red blood. He states that he has taken numerous tablets of Excedrin for pain. He denies fever, but feels chills and rigors.  He denies chest pain, palpitations, dizziness, diaphoresis, dyspnea, pitting edema of the lower extremities, PND or orthopnea.He denies melena, hematochezia, or  GU symptoms.  ED Course: The patient received 3+ liters of IV fluid bolus, Zofran, morphine 8 mg divided in 2 doses, Toradol 30 mg IVP and was started on Unasyn 3 g every 3 hours. Workup showed leukocytosis of 20 3.3K, hemoglobin level of 18.7, hyperglycemia 0367 mg/dL and CT scan imaging revealed a right upper second molar periapical abscess with associated right maxillary sinus inflammation (see report for further detail).   Review of Systems: As per HPI otherwise 10 point review of systems negative.    History reviewed. No pertinent past medical history.       Past Surgical History:  Procedure Laterality Date  . APPENDECTOMY       reports that he has been smoking Cigarettes.  He has never used smokeless tobacco. He reports that he does not drink alcohol. His drug history is not on file.  No Known Allergies       Family History  Problem Relation Age of Onset  . CVA Father   . Diabetes Mellitus II Father            Prior to Admission medications   Medication Sig Start Date End Date Taking? Authorizing Provider  acetaminophen (TYLENOL) 500 MG tablet Take 1,000 mg by mouth every 6 (six) hours as needed for mild pain, moderate pain or headache.   Yes Historical Provider, MD    Physical Exam:  Vitals:   06/16/16 1607 06/16/16 1608 06/16/16 1722 06/16/16 1816  BP:  (!) 168/111 (!) 158/112 (!) 159/108  Pulse:  (!) 135 (!) 131 (!) 123  Resp:  19 18 20   Temp:  97.9 F (36.6 C)    TempSrc:  Oral    SpO2:  97%  94%  Weight: 109.3 kg (241 lb)     Height: 6\' 1"  (1.854 m)         Constitutional: NAD, calm, comfortable       Vitals:   06/16/16 1607 06/16/16 1608 06/16/16 1722 06/16/16 1816  BP:  (!) 168/111 (!) 158/112 (!) 159/108  Pulse:  (!) 135 (!) 131 (!) 123  Resp:  19 18 20   Temp:  97.9 F (36.6 C)    TempSrc:  Oral    SpO2:  97%  94%  Weight: 109.3 kg (241 lb)     Height: 6\' 1"  (1.854 m)       Eyes: PERRL, lids and conjunctivae normal ENMT: Positive edema with mild erythema and tenderness to palpation in right maxillary area. Mucous membranes are moist. Posterior pharynx clear of any exudate or lesions. Poor dentition with numerous missing teeth and multiple cavities. Neck: supple, right-sided cervical adenopathies, no thyromegaly Respiratory: clear to auscultation bilaterally, no wheezing, no crackles. Normal respiratory effort. No accessory muscle use.  Cardiovascular: Tachycardic at 114 BPM, no murmurs / rubs / gallops. No extremity edema. 2+ pedal pulses. No carotid bruits.  Abdomen: no tenderness, no masses palpated. No hepatosplenomegaly. Bowel sounds positive.  Musculoskeletal: no clubbing / cyanosis. No joint deformity upper and lower extremities. Good ROM, no contractures. Normal muscle tone.  Skin: no rashes, lesions, ulcers. No induration Neurologic: CN 2-12 grossly intact. Sensation intact, DTR normal. Strength 5/5 in all 4.  Psychiatric: Normal judgment and insight. Alert and oriented x 4. Normal mood.     Labs on Admission: I have personally reviewed following labs and imaging studies  CBC:  LastLabs   Recent Labs Lab 06/16/16 1645  WBC 23.3*  NEUTROABS 18.7*  HGB 18.7*  HCT 52.1*  MCV 83.8  PLT 226     Basic Metabolic Panel:  LastLabs   Recent Labs Lab 06/16/16 1645  NA 138  K 4.1  CL 101  CO2 22  GLUCOSE 362*  BUN 13  CREATININE 0.94  CALCIUM 9.7     GFR: Estimated Creatinine Clearance: 108.4 mL/min (by C-G formula based on SCr of 0.94 mg/dL). Liver Function Tests:  LastLabs   Recent Labs Lab 06/16/16 1645  AST 26  ALT 23  ALKPHOS 90  BILITOT 1.9*  PROT 8.3*  ALBUMIN 4.4       Radiological Exams on Admission:  ImagingResults(Last48hours)  Ct Maxillofacial W Contrast  Result Date: 06/16/2016 CLINICAL DATA:  Right cheek swelling and redness. EXAM: CT MAXILLOFACIAL WITH CONTRAST TECHNIQUE:  Multidetector CT imaging of the maxillofacial structures was performed with intravenous contrast. Multiplanar CT image reconstructions were also generated. A small metallic BB was placed on the right temple in order to reliably differentiate right from left. CONTRAST:  136mL ISOVUE-300 IOPAMIDOL (ISOVUE-300) INJECTION 61% COMPARISON:  None FINDINGS: Very poor dentition with multiple upper and lower periapical lucencies. There is a periapical lucency involving the right upper second molar. There is adjacent mucosal thickening involving the right maxillary sinus. There is a small periosteal fluid collection overlying the anterior lateral aspect of the right side of maxilla which measures 7 by 18 mm, image 39 of series 13. Overlying subcutaneous fat  stranding and skin thickening is identified. Prominent right submandibular lymph node 9 mm in short axis. Right level-II node measures 1 cm, image 19 of series 3. The visualized intracranial contents are unremarkable. IMPRESSION: 1. Overall poor dentition with multiple periapical lucencies. 2. Right upper second molar periapical abscess with associated right maxillary sinus inflammation. Additionally, there is a small periosteal fluid collection/abscess associated with the right maxilla and overlying cellulitis. 3. Borderline enlarged right level 1 and 2 nodes are likely reactive. Electronically Signed   By: Kerby Moors M.D.   On: 06/16/2016 18:04   Dg Abd Acute W/chest  Result Date: 06/16/2016 CLINICAL DATA:  Vomiting EXAM: DG ABDOMEN ACUTE W/ 1V CHEST COMPARISON:  None. FINDINGS: PA view chest demonstrates mild linear atelectasis or scarring in the left base. No acute infiltrate or consolidation. No effusion. Heart size is normal. Mild atherosclerosis of the aorta. Supine and upright views of the abdomen demonstrate no free air beneath the diaphragm. Bowel gas pattern nonspecific with relatively decreased small bowel gas. There is gas and stool present in the  colon. There are no pathologic calcifications. Ovoid soft tissue opacity in the pelvis, it is felt consistent with bladder. No acute osseous abnormality. IMPRESSION: 1. Mild scarring or atelectasis in the left lung base 2. Nonspecific nonobstructed bowel gas pattern Electronically Signed   By: Donavan Foil M.D.   On: 06/16/2016 17:28      Assessment/Plan Principal Problem:   Sepsis due to oral infection ([place]) Admit to telemetry a/inpatient. Continue normal saline boluses, then maintenance IV fluids. Tylenol 650 mg by mouth every 6 hours when necessary. Toradol 30 mg IVP every 6 hours when necessary Morphine 4 mg IVP every 3 hours when necessary. Antiemetics as needed. Continue Unasyn 3 g every 6 hours. Patient advised to follow-up with dentist as an outpatient in the future.  Active Problems:   Nausea and vomiting Per patient, he had 3 episodes of hematemesis.  He has stated that he has been taking Excedrin often for this pain.  Check stool occult blood.  Continue IV hydration. Famotidine 20 mg IVP every 12 hours. Zofran 4 mg oral/IVP every 4 hours.    Hyperglycemia The patient denies polyuria, polydipsia or blurred vision. Start CBG monitoring a regular insulin sliding scale. Check hemoglobin A1c.    Sinus tachycardia Check electrocardiogram. Continue IV hydration and pain control. Check echocardiogram if it persists despite hydration and treatment.      Polycythemia Advised to cease smoking. Consider daily aspirin 81 mg prophylaxis as an outpatient, once GI symptoms resolved. Follow-up with PCP and/or hematology as an outpatient.    Hyperbilirubinemia No history of liver disease per patient. States that he drinks very seldom and in limited amounts.  Follow hepatic liver function test in a.m.    Tobacco use disorder Smokes about one third of a pack of cigarettes a day. Declined nicotine replacement therapy.     DVT prophylaxis: SCDs. Code Status:  Full code. Family Communication:  Disposition Plan: Admit for IV antibiotic therapy and further evaluation. Consults called:  Admission status: Telemetry/inpatient.   Reubin Milan MD [facility] Pager [phone].(  If 7PM-7AM, please contact night-coverage [email or web address] Password [email or web address]  06/16/2016, 7:52 PM

## 2016-06-19 NOTE — Anesthesia Preprocedure Evaluation (Addendum)
Anesthesia Evaluation  Patient identified by MRN, date of birth, ID band Patient awake    Reviewed: Allergy & Precautions, NPO status , Patient's Chart, lab work & pertinent test results  Airway Mallampati: I  TM Distance: >3 FB Neck ROM: Full    Dental   Pulmonary Current Smoker,    Pulmonary exam normal        Cardiovascular Normal cardiovascular exam     Neuro/Psych    GI/Hepatic   Endo/Other  diabetes  Renal/GU      Musculoskeletal   Abdominal   Peds  Hematology   Anesthesia Other Findings   Reproductive/Obstetrics                             Anesthesia Physical Anesthesia Plan  ASA: II  Anesthesia Plan: General   Post-op Pain Management:    Induction: Intravenous  Airway Management Planned: Nasal ETT  Additional Equipment:   Intra-op Plan: Utilization Of Total Body Hypothermia per surgeon request  Post-operative Plan: Extubation in OR  Informed Consent: I have reviewed the patients History and Physical, chart, labs and discussed the procedure including the risks, benefits and alternatives for the proposed anesthesia with the patient or authorized representative who has indicated his/her understanding and acceptance.     Plan Discussed with: CRNA and Surgeon  Anesthesia Plan Comments:        Anesthesia Quick Evaluation

## 2016-06-19 NOTE — Anesthesia Procedure Notes (Signed)
Procedure Name: Intubation Date/Time: 06/19/2016 8:39 AM Performed by: Carleene Cooper A Pre-anesthesia Checklist: Patient identified, Emergency Drugs available, Suction available, Patient being monitored and Timeout performed Patient Re-evaluated:Patient Re-evaluated prior to inductionOxygen Delivery Method: Circle system utilized Preoxygenation: Pre-oxygenation with 100% oxygen Intubation Type: IV induction Ventilation: Mask ventilation without difficulty Laryngoscope Size: Mac and 4 Grade View: Grade I Nasal Tubes: Nasal prep performed, Nasal Rae and Left Tube size: 7.5 mm Number of attempts: 2 Placement Confirmation: ETT inserted through vocal cords under direct vision,  positive ETCO2 and breath sounds checked- equal and bilateral Secured at: 27 cm Tube secured with: Tape Dental Injury: Teeth and Oropharynx as per pre-operative assessment  Comments: Afrin 2 puffs bilaterally x 3 prior to the OR. PreO2. Smooth IV induction. Easy mask. Lubricated nasal airways placed- #s 6, 7 and 8 with ease. DL X1 by CRNA. Grade 1 view. Unable to pass ETT.  DL X 1 by D. Ossey. Lubricated # 7.5 nasal rae placed by Dr. Conrad Naches with ease with a MAC 4 blade. Grade 1 view. ATNI. Nasal rae secured by Dr. Enrique Sack.

## 2016-06-19 NOTE — Transfer of Care (Signed)
Immediate Anesthesia Transfer of Care Note  Patient: Steve Keller  Procedure(s) Performed: Procedure(s) with comments: EXTRACTION OF TOOTH #'S 2,4,5,13,14,15,20,21,AND 30 WITH ALVEOLOPLASTY AND GROSS DEBRIDEMENT OF REMAINING TEETH (N/A) - Throat pack in: 0853 Throat pack out: 1015  Patient Location: PACU  Anesthesia Type:General  Level of Consciousness: awake, alert , oriented and patient cooperative  Airway & Oxygen Therapy: Patient Spontanous Breathing and Patient connected to face mask oxygen  Post-op Assessment: Report given to RN, Post -op Vital signs reviewed and stable and Patient moving all extremities  Post vital signs: Reviewed and stable  Last Vitals:  Vitals:   06/19/16 0650  BP: (!) 142/87  Pulse: 92  Resp: 18  Temp: 37.1 C    Last Pain:  Vitals:   06/19/16 0650  TempSrc: Oral      Patients Stated Pain Goal: 4 ([id number])  Complications: No apparent anesthesia complications

## 2016-06-22 LAB — CULTURE, BLOOD (ROUTINE X 2)
CULTURE: NO GROWTH
Culture: NO GROWTH

## 2016-07-01 ENCOUNTER — Ambulatory Visit (HOSPITAL_COMMUNITY): Payer: Managed Care, Other (non HMO) | Admitting: Dentistry

## 2016-07-03 ENCOUNTER — Ambulatory Visit (HOSPITAL_COMMUNITY): Payer: Self-pay | Admitting: Dentistry

## 2016-07-03 ENCOUNTER — Encounter (HOSPITAL_COMMUNITY): Payer: Self-pay | Admitting: Dentistry

## 2016-07-03 VITALS — BP 164/83 | HR 90 | Temp 98.0°F

## 2016-07-03 DIAGNOSIS — K0530 Chronic periodontitis, unspecified: Secondary | ICD-10-CM

## 2016-07-03 DIAGNOSIS — K08409 Partial loss of teeth, unspecified cause, unspecified class: Secondary | ICD-10-CM

## 2016-07-03 DIAGNOSIS — R220 Localized swelling, mass and lump, head: Secondary | ICD-10-CM

## 2016-07-03 NOTE — Patient Instructions (Signed)
PLAN: 1. Continue salt water rinses as needed to aid healing. 2. Brush teeth after meals and at bedtime. Floss at bedtime. 3. Follow-up with a new primary dentist for an exam, radiographs, and discussion of other dental treatment needs.   [name], DDS

## 2016-07-03 NOTE — Progress Notes (Signed)
POST OPERATIVE NOTE:  07/03/2016 Steve Keller [id number]  VITALS: BP (!) 164/83 (BP Location: Left Arm)   Pulse 90   Temp 98 F (36.7 C) (Oral)   LABS:  Lab Results  Component Value Date   WBC 7.5 06/18/2016   HGB 13.8 06/18/2016   HCT 41.0 06/18/2016   MCV 84.9 06/18/2016   PLT 174 06/18/2016   BMET    Component Value Date/Time   NA 137 06/18/2016 0549   K 3.6 06/18/2016 0549   CL 103 06/18/2016 0549   CO2 28 06/18/2016 0549   GLUCOSE 192 (H) 06/18/2016 0549   BUN 7 06/18/2016 0549   CREATININE 0.71 06/18/2016 0549   CALCIUM 8.4 (L) 06/18/2016 0549   GFRNONAA >60 06/18/2016 0549   GFRAA >60 06/18/2016 0549    No results found for: INR, PROTIME No results found for: PTT   Deveion Bartles is status post multiple extractions with alveoloplasty and gross debridement of remaining dentition in the operating room on 06/19/2016. Patient now presents for evaluation of healing and suture removal.  SUBJECTIVE: Patient with minimal complaints from dental extraction sites. Patient having problems with persistent headaches and is following up with physician for this.   EXAM: There is no sign of infection, heme, or ooze. Sutures are all gone. Extraction sites are healing in by generalized primary closure with several areas healing in by secondary intention. Right facial swelling has resolved.  PROCEDURE: The patient was given a chlorhexidine gluconate rinse for 30 seconds. No sutures noted.  ASSESSMENT: Post operative course is consistent with dental procedures performed in the OR.   PLAN: 1. Continue salt water rinses as needed to aid healing. 2. Brush teeth after meals and at bedtime. Floss at bedtime. 3. Follow-up with a new primary dentist for an exam, radiographs, and discussion of other dental treatment needs.   [name], DDS

## 2016-07-12 ENCOUNTER — Ambulatory Visit (INDEPENDENT_AMBULATORY_CARE_PROVIDER_SITE_OTHER): Payer: Managed Care, Other (non HMO) | Admitting: Physician Assistant

## 2016-07-12 VITALS — BP 149/86 | HR 102 | Temp 98.1°F | Resp 16 | Ht 73.0 in | Wt 251.0 lb

## 2016-07-12 DIAGNOSIS — R519 Headache, unspecified: Secondary | ICD-10-CM

## 2016-07-12 DIAGNOSIS — I10 Essential (primary) hypertension: Secondary | ICD-10-CM | POA: Diagnosis not present

## 2016-07-12 DIAGNOSIS — R51 Headache: Secondary | ICD-10-CM | POA: Diagnosis not present

## 2016-07-12 DIAGNOSIS — E119 Type 2 diabetes mellitus without complications: Secondary | ICD-10-CM | POA: Diagnosis not present

## 2016-07-12 LAB — POCT URINALYSIS DIP (MANUAL ENTRY)
BILIRUBIN UA: NEGATIVE
Glucose, UA: 500 — AB
Ketones, POC UA: NEGATIVE
Leukocytes, UA: NEGATIVE
NITRITE UA: NEGATIVE
PH UA: 5
PROTEIN UA: NEGATIVE
Spec Grav, UA: >=1.03
UROBILINOGEN UA: 0.2

## 2016-07-12 LAB — COMPLETE METABOLIC PANEL WITH GFR
ALBUMIN: 4.3 g/dL (ref 3.6–5.1)
ALK PHOS: 55 U/L (ref 40–115)
ALT: 32 U/L (ref 9–46)
AST: 21 U/L (ref 10–35)
BUN: 11 mg/dL (ref 7–25)
CALCIUM: 9.8 mg/dL (ref 8.6–10.3)
CHLORIDE: 102 mmol/L (ref 98–110)
CO2: 27 mmol/L (ref 20–31)
Creat: 0.91 mg/dL (ref 0.70–1.25)
Glucose, Bld: 246 mg/dL — ABNORMAL HIGH (ref 65–99)
POTASSIUM: 4.3 mmol/L (ref 3.5–5.3)
Sodium: 138 mmol/L (ref 135–146)
Total Bilirubin: 0.4 mg/dL (ref 0.2–1.2)
Total Protein: 6.8 g/dL (ref 6.1–8.1)

## 2016-07-12 LAB — POC MICROSCOPIC URINALYSIS (UMFC): Mucus: ABSENT

## 2016-07-12 LAB — TSH: TSH: 0.91 m[IU]/L (ref 0.40–4.50)

## 2016-07-12 MED ORDER — LISINOPRIL 10 MG PO TABS: 10.0000 mg | ORAL_TABLET | Freq: Every day | ORAL | 0 refills | Status: DC

## 2016-07-12 NOTE — Progress Notes (Signed)
MRN: [id number]  Subjective:   Steve Keller is a 60 y.o. male who presents for follow up of Type 2 diabetes mellitus. Pt needs a PCP. He was admitted to [facility] due to sepsis due to oral infection on 06/16/16.   Diagnosis was made 06/2016. Patient is currently managed with Metformin 500mg  BID and glimeperide 4mg  daily since 06/16/2016 which has been somewhat effective. Admits good compliance. Denies adverse effects including metallic taste, hypoglycemia, nausea, and vomiting..   Patient is checking home blood sugars. Home blood sugar records: BGs range between 160 and 180. Current symptoms include headache. Patient denies nausea, paresthesia of the feet, polydipsia, polyuria, visual disturbances and vomiting. Patient is checking their feet daily. No oot concerns. Last diabetic eye exam eye exam: never.   Diet: cut out his sweets, he is eating more green vegetables, not frying anything, eating more grilled chicken. Prior to dx he was drinking lots of gatorade. He has cut that out and is now drinking water and unsweetened tea.  Exercise includes walking at least one mile a day. Currently smokes 2-3 cigarettes per day.   Known diabetic complications: none  Immunizations: Flu vaccine: no, shingles: no, pneumococal vaccine   Other concerns: Discharge physician recommends addition of ACE for BP control.  Pt notes he has also been around 140/70 until his recent admission.  Pt is not fasting today.   Review of Systems  Constitutional: Negative for chills, fever and malaise/fatigue.  Eyes: Negative for blurred vision, double vision and pain.  Cardiovascular: Negative for chest pain, palpitations and leg swelling.   Social History   Social History  . Marital status: Single    Spouse name: N/A  . Number of children: N/A  . Years of education: N/A   Occupational History  . Not on file.   Social History Main Topics  . Smoking status: Current Every Day Smoker    Packs/day: 0.25   Years: 44.00    Types: Cigarettes  . Smokeless tobacco: Never Used  . Alcohol use No  . Drug use: No  . Sexual activity: Not on file   Other Topics Concern  . Not on file   Social History Narrative  . No narrative on file    Objective:   PHYSICAL EXAM BP (!) 149/86 (BP Location: Left Arm, Patient Position: Sitting, Cuff Size: Normal)   Pulse (!) 102   Temp 98.1 F (36.7 C) (Oral)   Resp 16   Ht 6\' 1"  (1.854 m)   Wt 251 lb (113.9 kg)   SpO2 95%   BMI 33.12 kg/m   Physical Exam  Constitutional: He is oriented to person, place, and time. He appears well-developed and well-nourished.  HENT:  Head: Normocephalic and atraumatic.    Eyes: Conjunctivae are normal. Pupils are equal, round, and reactive to light.  Neck: Normal range of motion.  Cardiovascular: Regular rhythm and normal heart sounds.  Tachycardia present.   Pulmonary/Chest: Effort normal and breath sounds normal.  Neurological: He is alert and oriented to person, place, and time.  Skin: Skin is warm and dry.  Psychiatric: He has a normal mood and affect.  Vitals reviewed.     BP Readings from Last 3 Encounters:  07/12/16 (!) 149/86  07/03/16 (!) 164/83  06/19/16 (!) 179/97     Diabetic Foot Exam - Simple   Simple Foot Form Diabetic Foot exam was performed with the following findings:  Yes 07/12/2016  4:37 PM  Visual Inspection No deformities, no ulcerations, no  other skin breakdown bilaterally:  Yes Sensation Testing Intact to touch and monofilament testing bilaterally:  Yes Pulse Check Posterior Tibialis and Dorsalis pulse intact bilaterally:  Yes Comments     Results for orders placed or performed in visit on 07/12/16 (from the past 24 hour(s))  POCT urinalysis dipstick     Status: Abnormal   Collection Time: 07/12/16  4:34 PM  Result Value Ref Range   Color, UA yellow yellow   Clarity, UA clear clear   Glucose, UA =500 (A) negative   Bilirubin, UA negative negative   Ketones, POC UA  negative negative   Spec Grav, UA >=1.030    Blood, UA trace-lysed (A) negative   pH, UA 5.0    Protein Ur, POC negative negative   Urobilinogen, UA 0.2    Nitrite, UA Negative Negative   Leukocytes, UA Negative Negative  POCT Microscopic Urinalysis ([facility])     Status: None   Collection Time: 07/12/16  4:43 PM  Result Value Ref Range   WBC,UR,HPF,POC None None WBC/hpf   RBC,UR,HPF,POC None None RBC/hpf   Bacteria None None, Too numerous to count   Mucus Absent Absent   Epithelial Cells, UR Per Microscopy None None, Too numerous to count cells/hpf    Assessment and Plan :  1. Type 2 diabetes mellitus without complication, without long-term current use of insulin ([place]) Await results before providing prescriptions for medications. Goal is to increase metformin to max dose depending on results.  - Microalbumin, urine - COMPLETE METABOLIC PANEL WITH GFR - Ambulatory referral to diabetic education - HM Diabetes Foot Exam - Hemoglobin A1c; Future - Lipid panel; Future - COMPLETE METABOLIC PANEL WITH GFR; Future -Follow up in 8 weeks for reevaluation. Will need to get a lipid panel at this time and determine which statin to initiate.   2. Scalp tenderness - Sedimentation Rate  3. Essential hypertension - lisinopril (PRINIVIL,ZESTRIL) 10 MG tablet; Take 1 tablet (10 mg total) by mouth daily.  Dispense: 90 tablet; Refill: 0 - TSH - POCT urinalysis dipstick - POCT Microscopic Urinalysis ([facility]) -Goal is 140/90 -Follow up in 8 weeks.   Tenna Delaine, PA-C  [facility] 07/12/2016 7:28 PM

## 2016-07-12 NOTE — Patient Instructions (Signed)
-I will contact you with your lab results and refill your diabetes medication at this time.  -Continue to check blood sugars at home. Goal is 105-120.  -Begin taking lisinopril 10mg . Check your bp regularly over the next 8 weeks. Your goal is <140/90.  -Contact our office if values become too low -Please follow up in 8 weeks for reevaluation. Do not eat prior to this appointment so we can check your fasting levels.  -If you develop any worsening symptoms, seek care sooner.     Diabetes Mellitus and Food It is important for you to manage your blood sugar (glucose) level. Your blood glucose level can be greatly affected by what you eat. Eating healthier foods in the appropriate amounts throughout the day at about the same time each day will help you control your blood glucose level. It can also help slow or prevent worsening of your diabetes mellitus. Healthy eating may even help you improve the level of your blood pressure and reach or maintain a healthy weight.  General recommendations for healthful eating and cooking habits include:  Eating meals and snacks regularly. Avoid going long periods of time without eating to lose weight.  Eating a diet that consists mainly of plant-based foods, such as fruits, vegetables, nuts, legumes, and whole grains.  Using low-heat cooking methods, such as baking, instead of high-heat cooking methods, such as deep frying. Work with your dietitian to make sure you understand how to use the Nutrition Facts information on food labels. HOW CAN FOOD AFFECT ME? Carbohydrates Carbohydrates affect your blood glucose level more than any other type of food. Your dietitian will help you determine how many carbohydrates to eat at each meal and teach you how to count carbohydrates. Counting carbohydrates is important to keep your blood glucose at a healthy level, especially if you are using insulin or taking certain medicines for diabetes mellitus. Alcohol Alcohol can  cause sudden decreases in blood glucose (hypoglycemia), especially if you use insulin or take certain medicines for diabetes mellitus. Hypoglycemia can be a life-threatening condition. Symptoms of hypoglycemia (sleepiness, dizziness, and disorientation) are similar to symptoms of having too much alcohol.  If your health care provider has given you approval to drink alcohol, do so in moderation and use the following guidelines:  Women should not have more than one drink per day, and men should not have more than two drinks per day. One drink is equal to:  12 oz of beer.  5 oz of wine.  1 oz of hard liquor.  Do not drink on an empty stomach.  Keep yourself hydrated. Have water, diet soda, or unsweetened iced tea.  Regular soda, juice, and other mixers might contain a lot of carbohydrates and should be counted. WHAT FOODS ARE NOT RECOMMENDED? As you make food choices, it is important to remember that all foods are not the same. Some foods have fewer nutrients per serving than other foods, even though they might have the same number of calories or carbohydrates. It is difficult to get your body what it needs when you eat foods with fewer nutrients. Examples of foods that you should avoid that are high in calories and carbohydrates but low in nutrients include:  Trans fats (most processed foods list trans fats on the Nutrition Facts label).  Regular soda.  Juice.  Candy.  Sweets, such as cake, pie, doughnuts, and cookies.  Fried foods. WHAT FOODS CAN I EAT? Eat nutrient-rich foods, which will nourish your body and keep you healthy.  The food you should eat also will depend on several factors, including:  The calories you need.  The medicines you take.  Your weight.  Your blood glucose level.  Your blood pressure level.  Your cholesterol level. You should eat a variety of foods, including:  Protein.  Lean cuts of meat.  Proteins low in saturated fats, such as fish, egg  whites, and beans. Avoid processed meats.  Fruits and vegetables.  Fruits and vegetables that may help control blood glucose levels, such as apples, mangoes, and yams.  Dairy products.  Choose fat-free or low-fat dairy products, such as milk, yogurt, and cheese.  Grains, bread, pasta, and rice.  Choose whole grain products, such as multigrain bread, whole oats, and brown rice. These foods may help control blood pressure.  Fats.  Foods containing healthful fats, such as nuts, avocado, olive oil, canola oil, and fish. DOES EVERYONE WITH DIABETES MELLITUS HAVE THE SAME MEAL PLAN? Because every person with diabetes mellitus is different, there is not one meal plan that works for everyone. It is very important that you meet with a dietitian who will help you create a meal plan that is just right for you.   This information is not intended to replace advice given to you by your health care provider. Make sure you discuss any questions you have with your health care provider.   Document Released: 06/20/2005 Document Revised: 10/14/2014 Document Reviewed: 08/20/2013 Elsevier Interactive Patient Education 2016 [employer].    Hypertension Hypertension is another name for high blood pressure. High blood pressure forces your heart to work harder to pump blood. A blood pressure reading has two numbers, which includes a higher number over a lower number (example: 110/72). HOME CARE   Have your blood pressure rechecked by your doctor.  Only take medicine as told by your doctor. Follow the directions carefully. The medicine does not work as well if you skip doses. Skipping doses also puts you at risk for problems.  Do not smoke.  Monitor your blood pressure at home as told by your doctor. GET HELP IF:  You think you are having a reaction to the medicine you are taking.  You have repeat headaches or feel dizzy.  You have puffiness (swelling) in your ankles.  You have trouble with your  vision. GET HELP RIGHT AWAY IF:   You get a very bad headache and are confused.  You feel weak, numb, or faint.  You get chest or belly (abdominal) pain.  You throw up (vomit).  You cannot breathe very well. MAKE SURE YOU:   Understand these instructions.  Will watch your condition.  Will get help right away if you are not doing well or get worse.   This information is not intended to replace advice given to you by your health care provider. Make sure you discuss any questions you have with your health care provider.   Document Released: 03/11/2008 Document Revised: 09/28/2013 Document Reviewed: 07/16/2013 Elsevier Interactive Patient Education 2016 [employer].    IF you received an x-ray today, you will receive an invoice from [facility]. Please contact [facility] at [phone] with questions or concerns regarding your invoice.   IF you received labwork today, you will receive an invoice from [employer]. Please contact [facility] at [phone] with questions or concerns regarding your invoice.   Our billing staff will not be able to assist you with questions regarding bills from these companies.  You will be contacted with  the lab results as soon as they are available. The fastest way to get your results is to activate your My Chart account. Instructions are located on the last page of this paperwork. If you have not heard from [place] regarding the results in 2 weeks, please contact this office.

## 2016-07-13 ENCOUNTER — Other Ambulatory Visit: Payer: Self-pay | Admitting: Physician Assistant

## 2016-07-13 DIAGNOSIS — E119 Type 2 diabetes mellitus without complications: Secondary | ICD-10-CM

## 2016-07-13 LAB — MICROALBUMIN, URINE: Microalb, Ur: 5.8 mg/dL

## 2016-07-13 LAB — SEDIMENTATION RATE: SED RATE: 4 mm/h (ref 0–20)

## 2016-07-13 MED ORDER — EMPAGLIFLOZIN 10 MG PO TABS: 10.0000 mg | ORAL_TABLET | Freq: Every day | ORAL | 2 refills | Status: DC

## 2016-07-13 MED ORDER — METFORMIN HCL 1000 MG PO TABS: 1000.0000 mg | ORAL_TABLET | Freq: Two times a day (BID) | ORAL | 0 refills | Status: DC

## 2016-07-13 NOTE — Progress Notes (Signed)
Pt contacted about lab results. His blood glucose was still elevated. Will increase metformin to 1000 BID, discontinue amaryl 4mg , and begin jardiance 10mg  daily. Pt understands to look out for symptoms of UTIs. He will follow up in 8 weeks for repeat labs. His electrolytes, liver enzymes, kidney function, TSH, and sed rate were all normal.

## 2016-07-17 ENCOUNTER — Telehealth: Payer: Self-pay

## 2016-07-17 NOTE — Telephone Encounter (Signed)
Pa for jardiance started today

## 2016-07-18 MED ORDER — DAPAGLIFLOZIN PROPANEDIOL 5 MG PO TABS
5.0000 mg | ORAL_TABLET | Freq: Every day | ORAL | 0 refills | Status: DC
Start: 2016-07-18 — End: 2016-09-04

## 2016-07-18 NOTE — Addendum Note (Signed)
Addended by: Tenna Delaine D on: 07/18/2016 05:57 PM   Modules accepted: Orders

## 2016-07-18 NOTE — Telephone Encounter (Signed)
Got a message back from covermymeds PA that was sent stating that pt has a different plan. However, checked [employer] formulary meds in class when starting a new PA and it appears that preferred alternatives in class are [place] and [place]. Do you want to try Rxing one of these for pt?

## 2016-07-18 NOTE — Telephone Encounter (Signed)
Yes I will prescribe Farxiga 5mg . Thank you.   Meds ordered this encounter  Medications  . dapagliflozin propanediol (FARXIGA) 5 MG TABS tablet    Sig: Take 5 mg by mouth daily. In the am.    Dispense:  90 tablet    Refill:  0    Order Specific Question:   Supervising Provider    Answer:   Wardell Honour [[id number]]

## 2016-07-22 NOTE — Telephone Encounter (Signed)
Spoke to pt to make sure he is aware that Wilder Glade was sent in. He was not, but will go pick it up and begin taking it. He reported that his HAs have not eased up since starting the BP med. It is a constant HA and still has scalp tenderness too. I advised pt to RTC for re-check to see if provider would want to start another medication, or refer to neuro. Pt agreed.

## 2016-08-14 ENCOUNTER — Telehealth: Payer: Self-pay

## 2016-08-14 NOTE — Telephone Encounter (Signed)
Spoke with pt.  He has not taken BP since office visit with [place].   He has not experienced any dizzy spells or light headedness.  Pt will get BP checked and call in message for [place].  Advised pt to get checked at same place each time for continuity and vary times of day, ie after work one day and before work or mid day on the weekend to ensure a constant BP and not low at times.   Following his return call with BP reading for [place], we will send in his refills.

## 2016-08-14 NOTE — Telephone Encounter (Signed)
Call back from [place].  She spoke with pt who states [place] told him to take med 2x daily.    Will advise pharmacist following discussion with [place].

## 2016-08-14 NOTE — Telephone Encounter (Signed)
Received fax request to refill Lisinopril 10 mg tabs. Per [place] [phone].  Pt given #90 at visit on 10/6 with EPIC instructions to take 1 tab per day.  Per fax request from [place] [facility] is take one tablet by mouth twice daily.  Spoke with pharmacist Raquel Sarna - who states it was an input error on the part of [facility] Pharmacist.  She will call patient and advise of mistake.  They will give pt the remaining 60 tabs with instructions to take 1 (once) per day.  Msg to [place]

## 2016-09-04 ENCOUNTER — Ambulatory Visit (INDEPENDENT_AMBULATORY_CARE_PROVIDER_SITE_OTHER): Payer: Managed Care, Other (non HMO) | Admitting: Physician Assistant

## 2016-09-04 DIAGNOSIS — E119 Type 2 diabetes mellitus without complications: Secondary | ICD-10-CM

## 2016-09-04 DIAGNOSIS — I10 Essential (primary) hypertension: Secondary | ICD-10-CM | POA: Diagnosis not present

## 2016-09-04 DIAGNOSIS — Z23 Encounter for immunization: Secondary | ICD-10-CM | POA: Diagnosis not present

## 2016-09-04 LAB — COMPLETE METABOLIC PANEL WITH GFR
ALBUMIN: 4.4 g/dL (ref 3.6–5.1)
ALK PHOS: 56 U/L (ref 40–115)
ALT: 29 U/L (ref 9–46)
AST: 22 U/L (ref 10–35)
BILIRUBIN TOTAL: 0.4 mg/dL (ref 0.2–1.2)
BUN: 11 mg/dL (ref 7–25)
CALCIUM: 9.8 mg/dL (ref 8.6–10.3)
CO2: 27 mmol/L (ref 20–31)
CREATININE: 0.9 mg/dL (ref 0.70–1.25)
Chloride: 101 mmol/L (ref 98–110)
GFR, Est African American: >89 mL/min (ref >=60)
GFR, Est Non African American: >89 mL/min (ref >=60)
GLUCOSE: 138 mg/dL — AB (ref 65–99)
POTASSIUM: 4.4 mmol/L (ref 3.5–5.3)
Sodium: 138 mmol/L (ref 135–146)
TOTAL PROTEIN: 6.8 g/dL (ref 6.1–8.1)

## 2016-09-04 LAB — LIPID PANEL
CHOL/HDL RATIO: 6 ratio — AB (ref <5.0)
CHOLESTEROL: 162 mg/dL (ref <200)
HDL: 27 mg/dL — ABNORMAL LOW (ref >40)
LDL Cholesterol: 71 mg/dL (ref <100)
Triglycerides: 322 mg/dL — ABNORMAL HIGH (ref <150)
VLDL: 64 mg/dL — ABNORMAL HIGH (ref <30)

## 2016-09-04 LAB — POCT GLYCOSYLATED HEMOGLOBIN (HGB A1C): HEMOGLOBIN A1C: 7.2

## 2016-09-04 MED ORDER — LISINOPRIL 10 MG PO TABS: 10.0000 mg | ORAL_TABLET | Freq: Every day | ORAL | 0 refills | Status: DC

## 2016-09-04 MED ORDER — METFORMIN HCL 1000 MG PO TABS: 1000.0000 mg | ORAL_TABLET | Freq: Two times a day (BID) | ORAL | 0 refills | Status: DC

## 2016-09-04 NOTE — Patient Instructions (Addendum)
Continue taking metformin daily for diabetes and eating a great diet!  Start taking lisinopril 10mg  daily for htn. Continue monitoring your glucose and blood pressure.  Follow up with me in 3 months at the [facility]. I will be taking appointments on Wednesdays.  Thank you for letting me participate in your health and well being.     IF you received an x-ray today, you will receive an invoice from [facility]. Please contact [facility] at [phone] with questions or concerns regarding your invoice.   IF you received labwork today, you will receive an invoice from [employer]. Please contact [facility] at [phone] with questions or concerns regarding your invoice.   Our billing staff will not be able to assist you with questions regarding bills from these companies.  You will be contacted with the lab results as soon as they are available. The fastest way to get your results is to activate your My Chart account. Instructions are located on the last page of this paperwork. If you have not heard from [place] regarding the results in 2 weeks, please contact this office.

## 2016-09-04 NOTE — Progress Notes (Signed)
MRN: [id number]  Subjective:   Steve Keller is a 60 y.o. male who presents for follow up of Type 2 diabetes mellitus.   Diagnosis was made 06/2016. Patient is currently managed with Continued metformin which has been somewhat effective. Admits good compliance. Denies adverse effects including metallic taste, hypoglycemia, nausea, and vomiting.   Patient is checking home blood sugars. Home blood sugar records: 170s. Current symptoms include none. Patient denies foot ulcerations, nausea, paresthesia of the feet, polyuria, visual disturbances and vomiting. Patient is checking their feet daily. No foot concerns. Last diabetic eye exam: never.   Diet has changed tremendously. It now consists of boneless chicken breast, broccoli, brussel sprouts, and carrots with the occasional cheat dates. Exercise includes walking at least 4 times a week on the treadmill (walking about a mile). Continues to smoke but has decreased amount. A pack of cigarettes lasts him about one week now. Has also decreased alcohol. Known diabetic complications: none  Immunizations: Flu vaccine: declines, shingles: never because he states he never had chickenpox as a child, pneumococal vaccine: never   Other concerns: HTN: Currently not taking medication. He was started on lisinopril 10mg  at his last visit and instructed to take one daily but the pharmacist wrote to take 2 on his pill bottle so he ran out early and was feeling nauseous while taking that much. Patient is checking blood pressure at home, range is 0000000 systolic since he stopped the medication. Reports minimal headache every now and then . Denies lightheadedness, dizziness, double vision, chest pain, shortness of breath, heart racing, palpitations, nausea, vomiting, abdominal pain, hematuria, lower leg swelling.     Objective:   PHYSICAL EXAM BP 140/88 (BP Location: Left Arm, Patient Position: Sitting, Cuff Size: Large)   Pulse 96   Temp 98.3 F (36.8 C)  (Oral)   Resp 17   Ht 6\' 1"  (1.854 m)   Wt 257 lb (116.6 kg)   SpO2 98%   BMI 33.91 kg/m   Physical Exam  Constitutional: He is oriented to person, place, and time. He appears well-developed and well-nourished.  HENT:  Head: Normocephalic and atraumatic.  Right Ear: Tympanic membrane, external ear and ear canal normal.  Left Ear: Tympanic membrane, external ear and ear canal normal.  Nose: Nose normal.  Mouth/Throat: Uvula is midline, oropharynx is clear and moist and mucous membranes are normal.  Eyes: Conjunctivae are normal.  Neck: Normal range of motion.  Cardiovascular: Normal rate, regular rhythm, normal heart sounds and intact distal pulses.   Pulmonary/Chest: Effort normal and breath sounds normal.  Abdominal: Soft.  Neurological: He is alert and oriented to person, place, and time.  Skin: Skin is warm and dry.  Psychiatric: He has a normal mood and affect.  Vitals reviewed.   Results for orders placed or performed in visit on 09/04/16 (from the past 24 hour(s))  POCT glycosylated hemoglobin (Hb A1C)     Status: None   Collection Time: 09/04/16  8:30 AM  Result Value Ref Range   Hemoglobin A1C 7.2     Assessment and Plan :  1. Type 2 diabetes mellitus without complication, without long-term current use of insulin ([facility]) -Continue healthy lifestyle changes. Follow up in 3 months. Continue to monitor blood glucose.  - Lipid panel - COMPLETE METABOLIC PANEL WITH GFR - POCT glycosylated hemoglobin (Hb A1C) - Pneumococcal polysaccharide vaccine 23-valent greater than or equal to 2yo subcutaneous/IM - metFORMIN (GLUCOPHAGE) 1000 MG tablet; Take 1 tablet (1,000 mg total) by mouth 2 (  two) times daily with a meal.  Dispense: 180 tablet; Refill: 0  2. Essential hypertension -Start taking bp meds again, monitor bp.  - lisinopril (PRINIVIL,ZESTRIL) 10 MG tablet; Take 1 tablet (10 mg total) by mouth daily.  Dispense: 90 tablet; Refill: 0  Tenna Delaine, PA-C  [facility] 09/04/2016 2:01 PM

## 2016-09-05 ENCOUNTER — Other Ambulatory Visit: Payer: Self-pay | Admitting: Physician Assistant

## 2016-09-05 MED ORDER — ATORVASTATIN CALCIUM 20 MG PO TABS: 20.0000 mg | ORAL_TABLET | Freq: Every day | ORAL | 0 refills | Status: DC

## 2016-09-05 NOTE — Progress Notes (Signed)
Meds ordered this encounter  Medications  . atorvastatin (LIPITOR) 20 MG tablet    Sig: Take 1 tablet (20 mg total) by mouth daily.    Dispense:  90 tablet    Refill:  0    Order Specific Question:   Supervising Provider    Answer:   Wardell Honour [[id number]]   Contacted via phone of adding lipitor to medication regimen.

## 2016-09-25 ENCOUNTER — Ambulatory Visit: Payer: Managed Care, Other (non HMO) | Admitting: Physician Assistant

## 2016-12-18 ENCOUNTER — Ambulatory Visit (INDEPENDENT_AMBULATORY_CARE_PROVIDER_SITE_OTHER): Payer: BLUE CROSS/BLUE SHIELD | Admitting: Physician Assistant

## 2016-12-18 ENCOUNTER — Encounter: Payer: Self-pay | Admitting: Physician Assistant

## 2016-12-18 VITALS — BP 148/85 | HR 91 | Temp 98.3°F | Ht 73.0 in | Wt 258.0 lb

## 2016-12-18 DIAGNOSIS — E1140 Type 2 diabetes mellitus with diabetic neuropathy, unspecified: Secondary | ICD-10-CM

## 2016-12-18 DIAGNOSIS — I10 Essential (primary) hypertension: Secondary | ICD-10-CM | POA: Diagnosis not present

## 2016-12-18 DIAGNOSIS — Z1211 Encounter for screening for malignant neoplasm of colon: Secondary | ICD-10-CM

## 2016-12-18 DIAGNOSIS — E119 Type 2 diabetes mellitus without complications: Secondary | ICD-10-CM | POA: Diagnosis not present

## 2016-12-18 LAB — POCT GLYCOSYLATED HEMOGLOBIN (HGB A1C): HEMOGLOBIN A1C: 6.9

## 2016-12-18 MED ORDER — METFORMIN HCL 1000 MG PO TABS: 1000.0000 mg | ORAL_TABLET | Freq: Two times a day (BID) | ORAL | 1 refills | Status: DC

## 2016-12-18 MED ORDER — ATORVASTATIN CALCIUM 20 MG PO TABS: 20.0000 mg | ORAL_TABLET | Freq: Every day | ORAL | 3 refills | Status: DC

## 2016-12-18 MED ORDER — GABAPENTIN 100 MG PO CAPS: ORAL_CAPSULE | ORAL | 1 refills | Status: DC

## 2016-12-18 MED ORDER — LISINOPRIL 10 MG PO TABS: 10.0000 mg | ORAL_TABLET | Freq: Every day | ORAL | 1 refills | Status: DC

## 2016-12-18 NOTE — Patient Instructions (Addendum)
Buy over the counter eucerin cream for arms, put this on daily for relief of itching and dry skin.  For your foot pain, start taking gabapentin as this is likely due to nerve damage from diabetes. Gabapentin comes in 100mg  tablets. I want you to start with 100mg  once a day for three days. Then increase to 100mg  twice a day for three days. Then increase to 100 mg three times a day daily. I would like you to call me in 2 weeks and let me know how this is working for you because if you feel like it is working but you need a stronger dose we can give you a prescription for 300mg  tablets to take up to three times a day.  You have to call to get a refill on this medication so I know what dose to prescribe.   Otherwise, continue what you are doing! Keep working on that [employer] and exercising. The less you smoke the better!! Your A1C is 6.9, which is awesome!!!   I want you to keep taking metformin 1000mg  twice daily, lisinopril 10mg  daily and lipitor 20mg  daily for diabetes, htn, and high cholesterol.  Keep checking your sugars, if it ever goes too low (<60) please let our office know. How about you bring in your glucometer to your next appointment so we can calibrate it with ours.   I would like you to follow up in 3-6 months for recheck.    Diabetic Neuropathy Diabetic neuropathy is a nerve disease or nerve damage that is caused by diabetes mellitus. About half of all people with diabetes mellitus have some form of nerve damage. Nerve damage is more common in those who have had diabetes mellitus for many years and who generally have not had good control of their blood sugar (glucose) level. Diabetic neuropathy is a common complication of diabetes mellitus. There are three common types of diabetic neuropathy and a fourth type that is less common and less understood:  Peripheral neuropathy-This is the most common type of diabetic neuropathy. It causes damage to the nerves of the feet and legs first and  then eventually the hands and arms. The damage affects the ability to sense touch.  Autonomic neuropathy-This type causes damage to the autonomic nervous system, which controls the following functions:  Heartbeat.  Body temperature.  Blood pressure.  Urination.  Digestion.  Sweating.  Sexual function.  Focal neuropathy-Focal neuropathy can be painful and unpredictable and occurs most often in older adults with diabetes mellitus. It involves a specific nerve or one area and often comes on suddenly. It usually does not cause long-term problems.  Radiculoplexus neuropathy- Sometimes called lumbosacral radiculoplexus neuropathy, radiculoplexus neuropathy affects the nerves of the thighs, hips, buttocks, or legs. It is more common in people with type 2 diabetes mellitus and in older men. It is characterized by debilitating pain, weakness, and atrophy, usually in the thigh muscles. What are the causes? The cause of peripheral, autonomic, and focal neuropathies is diabetes mellitus that is uncontrolled and high glucose levels. The cause of radiculoplexus neuropathy is unknown. However, it is thought to be caused by inflammation related to uncontrolled glucose levels. What are the signs or symptoms? Peripheral Neuropathy  Peripheral neuropathy develops slowly over time. When the nerves of the feet and legs no longer work there may be:  Burning, stabbing, or aching pain in the legs or feet.  Inability to feel pressure or pain in your feet. This can lead to:  Thick calluses over  pressure areas.  Pressure sores.  Ulcers.  Foot deformities.  Reduced ability to feel temperature changes.  Muscle weakness. Autonomic Neuropathy  The symptoms of autonomic neuropathy vary depending on which nerves are affected. Symptoms may include:  Problems with digestion, such as:  Feeling sick to your stomach (nausea).  Vomiting.  Bloating.  Constipation.  Diarrhea.  Abdominal  pain.  Difficulty with urination. This occurs if you lose your ability to sense when your bladder is full. Problems include:  Urine leakage (incontinence).  Inability to empty your bladder completely (retention).  Rapid or irregular heartbeat (palpitations).  Blood pressure drops when you stand up (orthostatic hypotension). When you stand up you may feel:  Dizzy.  Weak.  Faint.  In men, inability to attain and maintain an erection.  In women, vaginal dryness and problems with decreased sexual desire and arousal.  Problems with body temperature regulation.  Increased or decreased sweating. Focal Neuropathy   Abnormal eye movements or abnormal alignment of both eyes.  Weakness in the wrist.  Foot drop. This results in an inability to lift the foot properly and abnormal walking or foot movement.  Paralysis on one side of your face (Bell palsy).  Chest or abdominal pain. Radiculoplexus Neuropathy   Sudden, severe pain in your hip, thigh, or buttocks.  Weakness and wasting of thigh muscles.  Difficulty rising from a seated position.  Abdominal swelling.  Unexplained weight loss (usually more than 10 lb [4.5 kg]). How is this diagnosed? Peripheral Neuropathy  Your senses may be tested. Sensory function testing can be done with:  A light touch using a monofilament.  A vibration with tuning fork.  A sharp sensation with a pin prick. Other tests that can help diagnose neuropathy are:  Nerve conduction velocity. This test checks the transmission of an electrical current through a nerve.  Electromyography. This shows how muscles respond to electrical signals transmitted by nearby nerves.  Quantitative sensory testing. This is used to assess how your nerves respond to vibrations and changes in temperature. Autonomic Neuropathy  Diagnosis is often based on reported symptoms. Tell your health care provider if you  experience:  Dizziness.  Constipation.  Diarrhea.  Inappropriate urination or inability to urinate.  Inability to get or maintain an erection. Tests that may be done include:  Electrocardiography or Holter monitor. These are tests that can help show problems with the heart rate or heart rhythm.  An X-ray exam may be done. Focal Neuropathy  Diagnosis is made based on your symptoms and what your health care provider finds during your exam. Other tests may be done. They may include:  Nerve conduction velocities. This checks the transmission of electrical current through a nerve.  Electromyography. This shows how muscles respond to electrical signals transmitted by nearby nerves.  Quantitative sensory testing. This test is used to assess how your nerves respond to vibration and changes in temperature. Radiculoplexus Neuropathy   Often the first thing is to eliminate any other issue or problems that might be the cause, as there is no standard test for diagnosis.  X-ray exam of your spine and lumbar region.  Spinal tap to rule out cancer.  MRI to rule out other lesions. How is this treated? Once nerve damage occurs, it cannot be reversed. The goal of treatment is to keep the disease or nerve damage from getting worse and affecting more nerve fibers. Controlling your blood glucose level is the key. Most people with radiculoplexus neuropathy see at least a partial improvement  over time. You will need to keep your blood glucose and HbA1c levels in the target range determined by your health care provider. Things that help control blood glucose levels include:  Blood glucose monitoring.  Meal planning.  Physical activity.  Diabetes medicine. Over time, maintaining lower blood glucose levels helps lessen symptoms. Sometimes, prescription pain medicine is needed. Follow these instructions at home:  Do not smoke.  Keep your blood glucose level in the range that you and your health  care provider have determined acceptable for you.  Keep your blood pressure level in the range that you and your health care provider have determined acceptable for you.  Eat a well-balanced diet.  Be physically active every day. Include strength training and balance exercises.  Protect your feet.  Check your feet every day for sores, cuts, blisters, or signs of infection.  Wear padded socks and supportive shoes. Use orthotic inserts, if necessary.  Regularly check the insides of your shoes for worn spots. Make sure there are no rocks or other items inside your shoes before you put them on. Contact a health care provider if:  You have burning, stabbing, or aching pain in the legs or feet.  You are unable to feel pressure or pain in your feet.  You develop problems with digestion such as:  Nausea.  Vomiting.  Bloating.  Constipation.  Diarrhea.  Abdominal pain.  You have difficulty with urination, such as:  Incontinence.  Retention.  You have palpitations.  You develop orthostatic hypotension. When you stand up you may feel:  Dizzy.  Weak.  Faint.  You cannot attain and maintain an erection (in men).  You have vaginal dryness and problems with decreased sexual desire and arousal (in women).  You have severe pain in your thighs, legs, or buttocks.  You have unexplained weight loss. This information is not intended to replace advice given to you by your health care provider. Make sure you discuss any questions you have with your health care provider. Document Released: 12/02/2001 Document Revised: 02/29/2016 Document Reviewed: 03/04/2013 Elsevier Interactive Patient Education  2017 [employer].   IF you received an x-ray today, you will receive an invoice from [facility]. Please contact [facility] at [phone] with questions or concerns regarding your invoice.   IF you received labwork today, you will receive an invoice from  [facility]. Please contact [facility] at [phone] with questions or concerns regarding your invoice.   Our billing staff will not be able to assist you with questions regarding bills from these companies.  You will be contacted with the lab results as soon as they are available. The fastest way to get your results is to activate your My Chart account. Instructions are located on the last page of this paperwork. If you have not heard from [place] regarding the results in 2 weeks, please contact this office.

## 2016-12-18 NOTE — Progress Notes (Addendum)
MRN: [id number]  Subjective:   Steve Keller is a 61 y.o. male who presents for follow up of Type 2 diabetes mellitus. Diagnosis was made 06/2016. Patient is currently managed with Continued metformin which has been effective. Admits good compliance. Denies adverse effects including metallic taste, hypoglycemia, nausea, vomiting. Patient is checking home blood sugars. Home blood sugar records: 74-90. New symptoms include burning in bilateral feet at the toe level, not extending down foot or into either calf, for the past 3 months. Patient denies numbness, foot rash, lower leg swelling, joint pain, foot ulcerations, nausea, polydipsia, polyuria, visual disturbances and weight loss. Patient is checking their feet daily.  Last diabetic eye exam eye exam: never.    Diet is still going good but did eat kind of bad over the holidays. He still avoids all fried foods. Exercise includes walking on the treadmill, however has not been as much due to the burning in his feet. Has decreased alcohol intake to 2 beers a week.  Has cut down on smoking,  pack of cigarettes last him about a week now. Immunizations: up to date on flu vaccine and pneumococcal vaccine.  HTN: On lisinopril 45m for HTN but has been out of it for a few weeks.  Denies lightheadedness, dizziness, chronic headache, double vision, chest pain, shortness of breath, heart racing, palpitations, nausea, vomiting, abdominal pain, and hematuria.  ROS Per HPI  Social History   Social History  . Marital status: Single    Spouse name: N/A  . Number of children: N/A  . Years of education: N/A   Occupational History  . Not on file.   Social History Main Topics  . Smoking status: Current Every Day Smoker    Packs/day: 0.15    Years: 44.00    Types: Cigarettes  . Smokeless tobacco: Never Used  . Alcohol use No  . Drug use: No  . Sexual activity: Not on file   Other Topics Concern  . Not on file   Social History Narrative  . No  narrative on file      Objective:   PHYSICAL EXAM BP (!) 148/85 (BP Location: Right Arm, Patient Position: Sitting, Cuff Size: Large)   Pulse 91   Temp 98.3 F (36.8 C) (Oral)   Ht _0  (1.854 m)   Wt 258 lb (117 kg)   SpO2 95%   BMI 34.04 kg/m   Physical Exam  Constitutional: He is oriented to person, place, and time. He appears well-developed and well-nourished.  HENT:  Head: Normocephalic and atraumatic.  Eyes: Conjunctivae are normal.  Neck: Normal range of motion.  Cardiovascular: Normal rate, regular rhythm and normal heart sounds.   Pulses:      Dorsalis pedis pulses are 2+ on the right side, and 2+ on the left side.       Posterior tibial pulses are 2+ on the right side, and 2+ on the left side.  Pulmonary/Chest: Effort normal and breath sounds normal.  Musculoskeletal:       Right foot: There is normal range of motion and no deformity.       Left foot: There is normal range of motion and no deformity.  Feet:  Right Foot:  Protective Sensation: 4 sites tested. 4 sites sensed.  Skin Integrity: Negative for ulcer, skin breakdown, erythema, warmth or dry skin.  Left Foot:  Protective Sensation: 4 sites tested. 4 sites sensed.  Skin Integrity: Negative for ulcer, skin breakdown, erythema, warmth or dry skin.  Neurological:  He is alert and oriented to person, place, and time.  Skin: Skin is warm and dry.  Psychiatric: He has a normal mood and affect.  Vitals reviewed.  Results for orders placed or performed in visit on 12/18/16 (from the past 24 hour(s))  POCT glycosylated hemoglobin (Hb A1C)     Status: None   Collection Time: 12/18/16  2:34 PM  Result Value Ref Range   Hemoglobin A1C 6.9    Assessment and Plan :  1. Type 2 diabetes mellitus without complication, without long-term current use of insulin (HCC) A1C well controlled at 6.9. Pt instructed to continue healthy lifestyle changes. Return in 3-6 months for diabetic recheck.  - CMP14+EGFR - POCT  glycosylated hemoglobin (Hb A1C) - Ambulatory referral to Ophthalmology - metFORMIN (GLUCOPHAGE) 1000 MG tablet; Take 1 tablet (1,000 mg total) by mouth 2 (two) times daily with a meal.  Dispense: 180 tablet; Refill: 1 - atorvastatin (LIPITOR) 20 MG tablet; Take 1 tablet (20 mg total) by mouth daily.  Dispense: 90 tablet; Refill: 3  2. Essential hypertension Not controlled in office today as patient is out of medication, he is asymptomatic. Instructed to restart lisinopril and contact me next time if he runs out of medication prior to next appointment.  - lisinopril (PRINIVIL,ZESTRIL) 10 MG tablet; Take 1 tablet (10 mg total) by mouth daily.  Dispense: 90 tablet; Refill: 1  3. Diabetic neuropathy, painful ([place]) History and PE consistent with diabetic neuropathy. Will start gabapentin and titrate up slowly. Pt instructed to contact me in 2 weeks and inform me of how the medication is working for him as I will need to provide additional refills before his follow up appointment in 6 months and will more than likely have to increase up to a dose of 329m TID. He has been informed to contact me BEFORE he runs out of his medication so I can refill it at the appropriate dose.  - gabapentin (NEURONTIN) 100 MG capsule; Take 1 tablet x 3 days, then increase to 1 tablet twice a day x 3 days, then 1 tablet three times a day every day.  Dispense: 90 capsule; Refill: 1  4. Screen for colon cancer - Ambulatory referral to Gastroenterology  BTenna Delaine PA-C  [facility]/14/2018 4:49 PM

## 2016-12-19 ENCOUNTER — Encounter: Payer: Self-pay | Admitting: Gastroenterology

## 2016-12-19 LAB — CMP14+EGFR
ALBUMIN: 4.8 g/dL (ref 3.6–4.8)
ALK PHOS: 62 IU/L (ref 39–117)
ALT: 34 IU/L (ref 0–44)
AST: 22 IU/L (ref 0–40)
Albumin/Globulin Ratio: 1.9 (ref 1.2–2.2)
BUN / CREAT RATIO: 13 (ref 10–24)
BUN: 11 mg/dL (ref 8–27)
Bilirubin Total: 0.2 mg/dL (ref 0.0–1.2)
CALCIUM: 9.8 mg/dL (ref 8.6–10.2)
CO2: 22 mmol/L (ref 18–29)
CREATININE: 0.85 mg/dL (ref 0.76–1.27)
Chloride: 98 mmol/L (ref 96–106)
GFR calc Af Amer: 109 mL/min/{1.73_m2} (ref >59)
GFR, EST NON AFRICAN AMERICAN: 94 mL/min/{1.73_m2} (ref >59)
GLOBULIN, TOTAL: 2.5 g/dL (ref 1.5–4.5)
GLUCOSE: 138 mg/dL — AB (ref 65–99)
Potassium: 4.3 mmol/L (ref 3.5–5.2)
Sodium: 142 mmol/L (ref 134–144)
TOTAL PROTEIN: 7.3 g/dL (ref 6.0–8.5)

## 2016-12-25 ENCOUNTER — Telehealth: Payer: Self-pay | Admitting: General Practice

## 2016-12-25 DIAGNOSIS — E1140 Type 2 diabetes mellitus with diabetic neuropathy, unspecified: Secondary | ICD-10-CM

## 2016-12-25 NOTE — Telephone Encounter (Signed)
Pt is needing to talk with Steve Keller his feet are not better   Best [phone]

## 2016-12-25 NOTE — Telephone Encounter (Signed)
The medicine for his feet and feet feel worse, pain and burning "gout and neuropathy" he thinks.  Does dissipate but always comes back strong and is missing work.  On the largest dose you gave now too.  "cant wait the full 2 weeks, its so bad"  Next step

## 2016-12-27 MED ORDER — GABAPENTIN 100 MG PO CAPS: ORAL_CAPSULE | ORAL | 0 refills | Status: DC

## 2016-12-27 NOTE — Telephone Encounter (Signed)
Pt advised, changed in med list, no refill sent yet

## 2016-12-27 NOTE — Telephone Encounter (Signed)
Please have him increase to 300mg  three times a day and try this for the next few days. If he is not having any relief by next week, please have him return. If it gets worse, seek care sooner. Thanks!

## 2017-01-01 ENCOUNTER — Encounter: Payer: Self-pay | Admitting: Physician Assistant

## 2017-01-01 LAB — HM DIABETES EYE EXAM

## 2017-01-17 ENCOUNTER — Other Ambulatory Visit: Payer: Self-pay | Admitting: Family Medicine

## 2017-01-17 DIAGNOSIS — E1140 Type 2 diabetes mellitus with diabetic neuropathy, unspecified: Secondary | ICD-10-CM

## 2017-01-17 NOTE — Telephone Encounter (Signed)
12/27/16 last refill

## 2017-01-17 NOTE — Telephone Encounter (Signed)
PT CALLING NEEDING A REFILL ON HIS GABAPENTIN HE SAID THE MEDICINE IS HELPING BUT HE WILL RUN OUT ON Monday PLEASE RESPOND

## 2017-01-20 MED ORDER — GABAPENTIN 100 MG PO CAPS: ORAL_CAPSULE | ORAL | 1 refills | Status: DC

## 2017-01-22 ENCOUNTER — Encounter: Payer: Self-pay | Admitting: Gastroenterology

## 2017-01-22 ENCOUNTER — Ambulatory Visit (AMBULATORY_SURGERY_CENTER): Payer: Self-pay | Admitting: *Deleted

## 2017-01-22 VITALS — Ht 73.0 in | Wt 259.0 lb

## 2017-01-22 DIAGNOSIS — Z1211 Encounter for screening for malignant neoplasm of colon: Secondary | ICD-10-CM

## 2017-01-22 MED ORDER — NA SULFATE-K SULFATE-MG SULF 17.5-3.13-1.6 GM/177ML PO SOLN: ORAL | 0 refills | Status: DC

## 2017-01-22 NOTE — Progress Notes (Signed)
Patient denies any allergies to eggs or soy. Patient denies any problems with anesthesia/sedation. Patient denies any oxygen use at home and does not take any diet/weight loss medications. EMMI education assisgned to patient on colonoscopy, this was explained and instructions given to patient. 

## 2017-02-05 ENCOUNTER — Ambulatory Visit (AMBULATORY_SURGERY_CENTER): Payer: BLUE CROSS/BLUE SHIELD | Admitting: Gastroenterology

## 2017-02-05 ENCOUNTER — Encounter: Payer: Self-pay | Admitting: Gastroenterology

## 2017-02-05 VITALS — BP 139/88 | HR 88 | Temp 98.9°F | Resp 17 | Ht 73.0 in | Wt 259.0 lb

## 2017-02-05 DIAGNOSIS — D125 Benign neoplasm of sigmoid colon: Secondary | ICD-10-CM | POA: Diagnosis not present

## 2017-02-05 DIAGNOSIS — D124 Benign neoplasm of descending colon: Secondary | ICD-10-CM

## 2017-02-05 DIAGNOSIS — Z1212 Encounter for screening for malignant neoplasm of rectum: Secondary | ICD-10-CM | POA: Diagnosis not present

## 2017-02-05 DIAGNOSIS — Z1211 Encounter for screening for malignant neoplasm of colon: Secondary | ICD-10-CM

## 2017-02-05 DIAGNOSIS — D122 Benign neoplasm of ascending colon: Secondary | ICD-10-CM

## 2017-02-05 DIAGNOSIS — D123 Benign neoplasm of transverse colon: Secondary | ICD-10-CM | POA: Diagnosis not present

## 2017-02-05 MED ORDER — SODIUM CHLORIDE 0.9 % IV SOLN: 500.0000 mL | INTRAVENOUS | Status: AC

## 2017-02-05 NOTE — Op Note (Addendum)
[place] Patient Name: Kahlil Cowans Procedure Date: 02/05/2017 8:28 AM MRN: [id number] Endoscopist: Mauri Pole , MD Age: 61 Referring MD:  Date of Birth: [birth year] Gender: Male Account #: [id number] Procedure:                Colonoscopy Indications:              Screening for colorectal malignant neoplasm, This                            is the patient's first colonoscopy Medicines:                Monitored Anesthesia Care Procedure:                Pre-Anesthesia Assessment:                           - Prior to the procedure, a History and Physical                            was performed, and patient medications and                            allergies were reviewed. The patient's tolerance of                            previous anesthesia was also reviewed. The risks                            and benefits of the procedure and the sedation                            options and risks were discussed with the patient.                            All questions were answered, and informed consent                            was obtained. Prior Anticoagulants: The patient has                            taken no previous anticoagulant or antiplatelet                            agents. ASA Grade Assessment: II - A patient with                            mild systemic disease. After reviewing the risks                            and benefits, the patient was deemed in                            satisfactory condition to undergo the procedure.  After obtaining informed consent, the colonoscope                            was passed under direct vision. Throughout the                            procedure, the patient's blood pressure, pulse, and                            oxygen saturations were monitored continuously. The                            Colonoscope was introduced through the anus and                            advanced to the the  terminal ileum, with                            identification of the appendiceal orifice and IC                            valve. The colonoscopy was performed without                            difficulty. The patient tolerated the procedure                            well. The quality of the bowel preparation was                            good. The terminal ileum, ileocecal valve,                            appendiceal orifice, and rectum were photographed. Scope In: 8:37:35 AM Scope Out: 9:12:33 AM Scope Withdrawal Time: 0 hours 31 minutes 17 seconds  Total Procedure Duration: 0 hours 34 minutes 58 seconds  Findings:                 The perianal and digital rectal examinations were                            normal.                           Four semi-pedunculated polyps were found in the                            descending colon, hepatic flexure and ascending                            colon. The polyps were 10 to 18 mm in size. These                            polyps were removed with a hot snare. Resection and  retrieval were complete.                           Four sessile polyps were found in the descending                            colon and ascending colon. The polyps were 5 to 9                            mm in size. These polyps were removed with a cold                            snare. Resection and retrieval were complete.                           A greater than 50 mm polyp was found in the sigmoid                            colon. The polyp was pedunculated. The polyp was                            removed with a hot snare. Resection and retrieval                            were complete. To prevent bleeding after the                            polypectomy, one hemostatic clip was successfully                            placed (MR conditional). There was no bleeding at                            the end of the procedure.                            Non-bleeding internal hemorrhoids were found during                            retroflexion. The hemorrhoids were medium-sized. Complications:            No immediate complications. Estimated Blood Loss:     Estimated blood loss was minimal. Impression:               - Four 10 to 18 mm polyps in the descending colon,                            at the hepatic flexure and in the ascending colon,                            removed with a hot snare. Resected and retrieved.                           -  Four 5 to 9 mm polyps in the descending colon and                            in the ascending colon, removed with a cold snare.                            Resected and retrieved.                           - One greater than 50 mm polyp in the sigmoid                            colon, removed with a hot snare. Resected and                            retrieved. Clip (MR conditional) was placed.                           - Non-bleeding internal hemorrhoids. Recommendation:           - Patient has a contact number available for                            emergencies. The signs and symptoms of potential                            delayed complications were discussed with the                            patient. Return to normal activities tomorrow.                            Written discharge instructions were provided to the                            patient.                           - Resume previous diet.                           - Continue present medications.                           - No aspirin, ibuprofen, naproxen, or other                            non-steroidal anti-inflammatory drugs for 2 weeks.                           - Await pathology results.                           - Repeat colonoscopy in 1 year for surveillance  based on pathology results.                           - Return to GI clinic PRN. Mauri Pole, MD 02/05/2017 9:21:02 AM This  report has been signed electronically.

## 2017-02-05 NOTE — Progress Notes (Signed)
Called to room to assist during endoscopic procedure.  Patient ID and intended procedure confirmed with present staff. Received instructions for my participation in the procedure from the performing physician.  

## 2017-02-05 NOTE — Patient Instructions (Signed)
YOU HAD AN ENDOSCOPIC PROCEDURE TODAY AT [facility]:   Refer to the procedure report that was given to you for any specific questions about what was found during the examination.  If the procedure report does not answer your questions, please call your gastroenterologist to clarify.  If you requested that your care partner not be given the details of your procedure findings, then the procedure report has been included in a sealed envelope for you to review at your convenience later.  YOU SHOULD EXPECT: Some feelings of bloating in the abdomen. Passage of more gas than usual.  Walking can help get rid of the air that was put into your GI tract during the procedure and reduce the bloating. If you had a lower endoscopy (such as a colonoscopy or flexible sigmoidoscopy) you may notice spotting of blood in your stool or on the toilet paper. If you underwent a bowel prep for your procedure, you may not have a normal bowel movement for a few days.  Please Note:  You might notice some irritation and congestion in your nose or some drainage.  This is from the oxygen used during your procedure.  There is no need for concern and it should clear up in a day or so.  SYMPTOMS TO REPORT IMMEDIATELY:   Following lower endoscopy (colonoscopy or flexible sigmoidoscopy):  Excessive amounts of blood in the stool  Significant tenderness or worsening of abdominal pains  Swelling of the abdomen that is new, acute  Fever of 100F or higher  For urgent or emergent issues, a gastroenterologist can be reached at any hour by calling [phone].   DIET:  We do recommend a small meal at first, but then you may proceed to your regular diet.  Drink plenty of fluids but you should avoid alcoholic beverages for 24 hours.  ACTIVITY:  You should plan to take it easy for the rest of today and you should NOT DRIVE or use heavy machinery until tomorrow (because of the sedation medicines used during the test).     FOLLOW UP: Our staff will call the number listed on your records the next business day following your procedure to check on you and address any questions or concerns that you may have regarding the information given to you following your procedure. If we do not reach you, we will leave a message.  However, if you are feeling well and you are not experiencing any problems, there is no need to return our call.  We will assume that you have returned to your regular daily activities without incident.  If any biopsies were taken you will be contacted by phone or by letter within the next 1-3 weeks.  Please call us at [phone] if you have not heard about the biopsies in 3 weeks.    SIGNATURES/CONFIDENTIALITY: You and/or your care partner have signed paperwork which will be entered into your electronic medical record.  These signatures attest to the fact that that the information above on your After Visit Summary has been reviewed and is understood.  Full responsibility of the confidentiality of this discharge information lies with you and/or your care-partner.  Polyp information given.  Hemorrhoid information given.  No aspirin, ibuprofen, naproxen, or other non steroidal anti-inflammatory medications for 2 weeks.]]  Repeat colonoscopy in one year.

## 2017-02-05 NOTE — Progress Notes (Signed)
To PACU, vss patent aw report to rn 

## 2017-02-06 ENCOUNTER — Telehealth: Payer: Self-pay | Admitting: *Deleted

## 2017-02-06 ENCOUNTER — Telehealth: Payer: Self-pay

## 2017-02-06 NOTE — Telephone Encounter (Signed)
  Follow up Call-  Call back number 02/05/2017  Post procedure Call Back phone  # [phone]  Permission to leave phone message Yes  Some recent data might be hidden     Patient questions:  Do you have a fever, pain , or abdominal swelling? No. Pain Score  0 *  Have you tolerated food without any problems? Yes.    Have you been able to return to your normal activities? Yes.    Do you have any questions about your discharge instructions: Diet   No. Medications  No. Follow up visit  No.  Do you have questions or concerns about your Care? No.  Actions: * If pain score is 4 or above: No action needed, pain <4.

## 2017-02-06 NOTE — Telephone Encounter (Signed)
  Follow up Call-  Call back number 02/05/2017  Post procedure Call Back phone  # [phone]  Permission to leave phone message Yes  Some recent data might be hidden     Left message

## 2017-02-11 ENCOUNTER — Encounter: Payer: Self-pay | Admitting: Gastroenterology

## 2017-02-12 ENCOUNTER — Encounter: Payer: Self-pay | Admitting: Physician Assistant

## 2017-02-12 ENCOUNTER — Ambulatory Visit (INDEPENDENT_AMBULATORY_CARE_PROVIDER_SITE_OTHER): Payer: BLUE CROSS/BLUE SHIELD | Admitting: Physician Assistant

## 2017-02-12 VITALS — BP 127/81 | HR 101 | Temp 98.2°F | Resp 16 | Ht 73.0 in | Wt 259.8 lb

## 2017-02-12 DIAGNOSIS — R208 Other disturbances of skin sensation: Secondary | ICD-10-CM | POA: Diagnosis not present

## 2017-02-12 DIAGNOSIS — E119 Type 2 diabetes mellitus without complications: Secondary | ICD-10-CM | POA: Insufficient documentation

## 2017-02-12 DIAGNOSIS — E1140 Type 2 diabetes mellitus with diabetic neuropathy, unspecified: Secondary | ICD-10-CM | POA: Diagnosis not present

## 2017-02-12 MED ORDER — GABAPENTIN 100 MG PO CAPS
ORAL_CAPSULE | ORAL | 5 refills | Status: DC
Start: 2017-02-12 — End: 2017-02-12

## 2017-02-12 MED ORDER — GABAPENTIN 100 MG PO CAPS: ORAL_CAPSULE | ORAL | 5 refills | Status: DC

## 2017-02-12 NOTE — Progress Notes (Signed)
Steve Keller  MRN: [id number] DOB: [birth year]  Subjective:  Steve Keller is a 61 y.o. male, with controlled T2DM,  seen in office today for a chief complaint of burning sensation in his feet x 5 months. He was evaluated for this by me during his last OV on 12/18/16. He was started on gabapentin and slowly titrated up to a dose of 374m TID. Notes this was working well for him at that dose but then he ran out of his medication has been without it for 3 weeks. States the burning sensation has returned. It is described as constant. It spans from the toes to the heel bilaterally, will occasionally affect the calf. Will have occasional numbness sensation, but that is rare. Denies tingling, weakness, ulcers, loss of sensation, claudication, skin changes, rash, and tight fitting shoes. Notes the burning sensation is present for most of the day but is more noticeable at night time when he is resting. For the past 3 weeks, he has tried ice, icy hot, and ibuprofen with no relief. Of note, pt does endorse that the gabapentin at a dose of 3092mdose make him sleepy, other than that he is tolerated the medication well.   Review of Systems  Constitutional: Negative for chills, diaphoresis and fever.  Respiratory: Negative for shortness of breath.   Cardiovascular: Negative for chest pain and palpitations.  Gastrointestinal: Negative for abdominal pain, nausea and vomiting.  Neurological: Negative for dizziness and light-headedness.    Patient Active Problem List   Diagnosis Date Noted  . Type 2 diabetes mellitus ([place]05/06/2017  . Chronic periodontitis 06/19/2016  . Polycythemia 06/16/2016  . Tobacco use disorder 06/16/2016    Current Outpatient Prescriptions on File Prior to Visit  Medication Sig Dispense Refill  . aspirin EC 81 MG tablet Take 81 mg by mouth daily.    . Marland Kitchentorvastatin (LIPITOR) 20 MG tablet Take 1 tablet (20 mg total) by mouth daily. 90 tablet 3  . blood glucose meter kit and  supplies Dispense based on patient and insurance preference. Use up to four times daily as directed. (FOR ICD-9 250.00, 250.01). 1 each 0  . lisinopril (PRINIVIL,ZESTRIL) 10 MG tablet Take 1 tablet (10 mg total) by mouth daily. 90 tablet 1  . metFORMIN (GLUCOPHAGE) 1000 MG tablet Take 1 tablet (1,000 mg total) by mouth 2 (two) times daily with a meal. 180 tablet 1   Current Facility-Administered Medications on File Prior to Visit  Medication Dose Route Frequency Provider Last Rate Last Dose  . 0.9 %  sodium chloride infusion  500 mL Intravenous Continuous Nandigam, Kavitha V, MD        No Known Allergies   Objective:  BP 127/81   Pulse (!) 101   Temp 98.2 F (36.8 C) (Oral)   Resp 16   Ht '6\' 1"'  (1.854 m)   Wt 259 lb 12.8 oz (117.8 kg)   SpO2 96%   BMI 34.28 kg/m   Physical Exam  Constitutional: He is oriented to person, place, and time and well-developed, well-nourished, and in no distress.  HENT:  Head: Normocephalic and atraumatic.  Eyes: Conjunctivae are normal.  Neck: Normal range of motion.  Pulmonary/Chest: Effort normal.  Musculoskeletal:       Right lower leg: Normal. He exhibits no tenderness and no swelling.       Left lower leg: Normal. He exhibits no tenderness and no swelling.       Right foot: Normal. There is normal capillary refill.  Left foot: Normal. There is normal capillary refill.  Neurological: He is alert and oriented to person, place, and time. Gait normal.  Vibration Sense and Proprioception of bilateral feet intact.   Skin: Skin is warm and dry.  Psychiatric: Affect normal.  Vitals reviewed.  Diabetic Foot Exam - Simple   Simple Foot Form Visual Inspection No deformities, no ulcerations, no other skin breakdown bilaterally:  Yes Sensation Testing Intact to touch and monofilament testing bilaterally:  Yes Pulse Check Posterior Tibialis and Dorsalis pulse intact bilaterally:  Yes Comments     Assessment and Plan :   1. Burning  sensation of feet Hx consistent with diabetic neuropathy. Await lab results.  - Vitamin B12 - [facility] - Basic metabolic panel  2. Diabetic neuropathy, painful ([place]) Will reinitiate gabapentin at this time with a slow taper up to 34m TID as patient was well controlled on this dose. Instructed to return to clinic if symptoms worsen or do not improve. Otherwise, plan for diabetes follow up in 4 months.  - gabapentin (NEURONTIN) 100 MG capsule; Titrate up slowly to 3026mthree times a day.  Dispense: 270 capsule; Refill: 5 MisenheimerA-C  [facility] 02/12/2017 8:30 PM

## 2017-02-12 NOTE — Patient Instructions (Addendum)
Start gabapentin again today. Increase slowly. Take 1 tablet x 3 days, then increase to 1 tablet twice a day x 3 days, then 1 tablet three times a day every day. Increase gabapentin up to 300mg  three times a day. Follow up if your pain worsens or does not improve. Otherwise, follow up in about 4 months for reevaluation. I will contact you in the next few days with your lab results.    IF you received an x-ray today, you will receive an invoice from [facility]. Please contact [facility] at [phone] with questions or concerns regarding your invoice.   IF you received labwork today, you will receive an invoice from [facility]. Please contact [facility] at [phone] with questions or concerns regarding your invoice.   Our billing staff will not be able to assist you with questions regarding bills from these companies.  You will be contacted with the lab results as soon as they are available. The fastest way to get your results is to activate your My Chart account. Instructions are located on the last page of this paperwork. If you have not heard from [place] regarding the results in 2 weeks, please contact this office.      Diabetic Neuropathy Diabetic neuropathy is a nerve disease or nerve damage that is caused by diabetes mellitus. About half of all people with diabetes mellitus have some form of nerve damage. Nerve damage is more common in those who have had diabetes mellitus for many years and who generally have not had good control of their blood sugar (glucose) level. Diabetic neuropathy is a common complication of diabetes mellitus. There are three common types of diabetic neuropathy and a fourth type that is less common and less understood:  Peripheral neuropathy-This is the most common type of diabetic neuropathy. It causes damage to the nerves of the feet and legs first and then eventually the hands and arms. The damage affects the ability to sense touch.  Autonomic  neuropathy-This type causes damage to the autonomic nervous system, which controls the following functions:  Heartbeat.  Body temperature.  Blood pressure.  Urination.  Digestion.  Sweating.  Sexual function.  Focal neuropathy-Focal neuropathy can be painful and unpredictable and occurs most often in older adults with diabetes mellitus. It involves a specific nerve or one area and often comes on suddenly. It usually does not cause long-term problems.  Radiculoplexus neuropathy- Sometimes called lumbosacral radiculoplexus neuropathy, radiculoplexus neuropathy affects the nerves of the thighs, hips, buttocks, or legs. It is more common in people with type 2 diabetes mellitus and in older men. It is characterized by debilitating pain, weakness, and atrophy, usually in the thigh muscles. What are the causes? The cause of peripheral, autonomic, and focal neuropathies is diabetes mellitus that is uncontrolled and high glucose levels. The cause of radiculoplexus neuropathy is unknown. However, it is thought to be caused by inflammation related to uncontrolled glucose levels. What are the signs or symptoms? Peripheral Neuropathy  Peripheral neuropathy develops slowly over time. When the nerves of the feet and legs no longer work there may be:  Burning, stabbing, or aching pain in the legs or feet.  Inability to feel pressure or pain in your feet. This can lead to:  Thick calluses over pressure areas.  Pressure sores.  Ulcers.  Foot deformities.  Reduced ability to feel temperature changes.  Muscle weakness. Autonomic Neuropathy  The symptoms of autonomic neuropathy vary depending on which nerves are affected. Symptoms may include:  Problems with digestion,  such as:  Feeling sick to your stomach (nausea).  Vomiting.  Bloating.  Constipation.  Diarrhea.  Abdominal pain.  Difficulty with urination. This occurs if you lose your ability to sense when your bladder is full.  Problems include:  Urine leakage (incontinence).  Inability to empty your bladder completely (retention).  Rapid or irregular heartbeat (palpitations).  Blood pressure drops when you stand up (orthostatic hypotension). When you stand up you may feel:  Dizzy.  Weak.  Faint.  In men, inability to attain and maintain an erection.  In women, vaginal dryness and problems with decreased sexual desire and arousal.  Problems with body temperature regulation.  Increased or decreased sweating. Focal Neuropathy   Abnormal eye movements or abnormal alignment of both eyes.  Weakness in the wrist.  Foot drop. This results in an inability to lift the foot properly and abnormal walking or foot movement.  Paralysis on one side of your face (Bell palsy).  Chest or abdominal pain. Radiculoplexus Neuropathy   Sudden, severe pain in your hip, thigh, or buttocks.  Weakness and wasting of thigh muscles.  Difficulty rising from a seated position.  Abdominal swelling.  Unexplained weight loss (usually more than 10 lb [4.5 kg]). How is this diagnosed? Peripheral Neuropathy  Your senses may be tested. Sensory function testing can be done with:  A light touch using a monofilament.  A vibration with tuning fork.  A sharp sensation with a pin prick. Other tests that can help diagnose neuropathy are:  Nerve conduction velocity. This test checks the transmission of an electrical current through a nerve.  Electromyography. This shows how muscles respond to electrical signals transmitted by nearby nerves.  Quantitative sensory testing. This is used to assess how your nerves respond to vibrations and changes in temperature. Autonomic Neuropathy  Diagnosis is often based on reported symptoms. Tell your health care provider if you experience:  Dizziness.  Constipation.  Diarrhea.  Inappropriate urination or inability to urinate.  Inability to get or maintain an erection. Tests  that may be done include:  Electrocardiography or Holter monitor. These are tests that can help show problems with the heart rate or heart rhythm.  An X-ray exam may be done. Focal Neuropathy  Diagnosis is made based on your symptoms and what your health care provider finds during your exam. Other tests may be done. They may include:  Nerve conduction velocities. This checks the transmission of electrical current through a nerve.  Electromyography. This shows how muscles respond to electrical signals transmitted by nearby nerves.  Quantitative sensory testing. This test is used to assess how your nerves respond to vibration and changes in temperature. Radiculoplexus Neuropathy   Often the first thing is to eliminate any other issue or problems that might be the cause, as there is no standard test for diagnosis.  X-ray exam of your spine and lumbar region.  Spinal tap to rule out cancer.  MRI to rule out other lesions. How is this treated? Once nerve damage occurs, it cannot be reversed. The goal of treatment is to keep the disease or nerve damage from getting worse and affecting more nerve fibers. Controlling your blood glucose level is the key. Most people with radiculoplexus neuropathy see at least a partial improvement over time. You will need to keep your blood glucose and HbA1c levels in the target range determined by your health care provider. Things that help control blood glucose levels include:  Blood glucose monitoring.  Meal planning.  Physical activity.  Diabetes medicine. Over time, maintaining lower blood glucose levels helps lessen symptoms. Sometimes, prescription pain medicine is needed. Follow these instructions at home:  Do not smoke.  Keep your blood glucose level in the range that you and your health care provider have determined acceptable for you.  Keep your blood pressure level in the range that you and your health care provider have determined  acceptable for you.  Eat a well-balanced diet.  Be physically active every day. Include strength training and balance exercises.  Protect your feet.  Check your feet every day for sores, cuts, blisters, or signs of infection.  Wear padded socks and supportive shoes. Use orthotic inserts, if necessary.  Regularly check the insides of your shoes for worn spots. Make sure there are no rocks or other items inside your shoes before you put them on. Contact a health care provider if:  You have burning, stabbing, or aching pain in the legs or feet.  You are unable to feel pressure or pain in your feet.  You develop problems with digestion such as:  Nausea.  Vomiting.  Bloating.  Constipation.  Diarrhea.  Abdominal pain.  You have difficulty with urination, such as:  Incontinence.  Retention.  You have palpitations.  You develop orthostatic hypotension. When you stand up you may feel:  Dizzy.  Weak.  Faint.  You cannot attain and maintain an erection (in men).  You have vaginal dryness and problems with decreased sexual desire and arousal (in women).  You have severe pain in your thighs, legs, or buttocks.  You have unexplained weight loss. This information is not intended to replace advice given to you by your health care provider. Make sure you discuss any questions you have with your health care provider. Document Released: 12/02/2001 Document Revised: 02/29/2016 Document Reviewed: 03/04/2013 Elsevier Interactive Patient Education  2017 [employer].

## 2017-02-13 LAB — RPR: RPR Ser Ql: NONREACTIVE

## 2017-02-13 LAB — VITAMIN B12: Vitamin B-12: 295 pg/mL (ref 232–1245)

## 2017-04-04 ENCOUNTER — Ambulatory Visit (INDEPENDENT_AMBULATORY_CARE_PROVIDER_SITE_OTHER): Payer: BLUE CROSS/BLUE SHIELD | Admitting: Physician Assistant

## 2017-04-04 ENCOUNTER — Encounter: Payer: Self-pay | Admitting: Physician Assistant

## 2017-04-04 VITALS — BP 122/75 | HR 79 | Temp 99.0°F | Resp 18 | Ht 73.0 in | Wt 247.8 lb

## 2017-04-04 DIAGNOSIS — M2550 Pain in unspecified joint: Secondary | ICD-10-CM | POA: Diagnosis not present

## 2017-04-04 MED ORDER — DICLOFENAC SODIUM 75 MG PO TBEC: 75.0000 mg | DELAYED_RELEASE_TABLET | Freq: Two times a day (BID) | ORAL | 0 refills | Status: DC

## 2017-04-04 NOTE — Progress Notes (Signed)
Steve Keller  MRN: [id number] DOB: 1955-12-07  Subjective:  Steve Keller is a 61 y.o. male with hx of T2DM and peripheral neuropathy, seen in office today for a chief complaint of foot, hand, and wrist pain x 3 weeks. The pain is located in his joints. It comes and goes. Describes it as a throbbing sensation. Will often notice swelling. Denies fever, chills,redness, warmth, and morning stiffness. States the pain is different than his peripheral neuropathy pain as he feels the burning associated with that is well controlled with gabapentin at the moment. Pt has no know hx of gout or autoimmune disease. He does have a FH of autoimmune diseases in 2 of his sisters. Pt is currently doing the nutrisystem diet. He does eat shrimp occasionally, but not in excess amounts. He does not drink alcohol. He smokes daily. Exercises regularly but note this pain is affecting both his exercise and ability to work. Has tried ibuprofen, tylenol, and excedrin with only mild relief. Denies changes in any of his medications.   Review of Systems  Gastrointestinal: Negative for abdominal pain, diarrhea and vomiting.  Skin: Negative for rash.  Neurological: Negative for dizziness, weakness, light-headedness and headaches.      Patient Active Problem List   Diagnosis Date Noted  . Type 2 diabetes mellitus ([place]) 02/12/2017  . Chronic periodontitis 06/19/2016  . Polycythemia 06/16/2016  . Tobacco use disorder 06/16/2016    Current Outpatient Prescriptions on File Prior to Visit  Medication Sig Dispense Refill  . aspirin EC 81 MG tablet Take 81 mg by mouth daily.    Marland Kitchen atorvastatin (LIPITOR) 20 MG tablet Take 1 tablet (20 mg total) by mouth daily. 90 tablet 3  . blood glucose meter kit and supplies Dispense based on patient and insurance preference. Use up to four times daily as directed. (FOR ICD-9 250.00, 250.01). 1 each 0  . gabapentin (NEURONTIN) 100 MG capsule Titrate up slowly to 35m three times a day. 270  capsule 5  . lisinopril (PRINIVIL,ZESTRIL) 10 MG tablet Take 1 tablet (10 mg total) by mouth daily. 90 tablet 1  . metFORMIN (GLUCOPHAGE) 1000 MG tablet Take 1 tablet (1,000 mg total) by mouth 2 (two) times daily with a meal. 180 tablet 1   Current Facility-Administered Medications on File Prior to Visit  Medication Dose Route Frequency Provider Last Rate Last Dose  . 0.9 %  sodium chloride infusion  500 mL Intravenous Continuous Nandigam, KVenia Minks MD        No Known Allergies     Social History   Social History  . Marital status: Single    Spouse name: N/A  . Number of children: N/A  . Years of education: N/A   Occupational History  . Not on file.   Social History Main Topics  . Smoking status: Current Every Day Smoker    Packs/day: 0.15    Years: 44.00    Types: Cigarettes  . Smokeless tobacco: Never Used  . Alcohol use No  . Drug use: No  . Sexual activity: Not on file   Other Topics Concern  . Not on file   Social History Narrative  . No narrative on file    Objective:  BP 122/75   Pulse 79   Temp 99 F (37.2 C) (Oral)   Resp 18   Ht '6\' 1"'  (1.854 m)   Wt 247 lb 12.8 oz (112.4 kg)   SpO2 96%   BMI 32.69 kg/m   Physical Exam  Constitutional: He is oriented to person, place, and time and well-developed, well-nourished, and in no distress.  HENT:  Head: Normocephalic and atraumatic.  Eyes: Conjunctivae are normal.  Neck: Normal range of motion.  Pulmonary/Chest: Effort normal.  Musculoskeletal:       Left wrist: He exhibits tenderness (with deep palpation ). He exhibits no swelling.       Right ankle: No tenderness.       Left ankle: He exhibits normal range of motion and no swelling. No tenderness.       Left hand: He exhibits tenderness (with palpation of 1st MCP). He exhibits normal capillary refill and no swelling.       Right foot: There is tenderness (with light and deep palpation of 1st-4th MTP joints). There is normal range of motion, no  swelling and normal capillary refill.       Left foot: There is tenderness (with deep palpaiton of 1st-3rd MTP joints ). There is normal range of motion and no swelling.  No erythema or warmth noted on bilateral feet, ankles, wrists, or hands.   Neurological: He is alert and oriented to person, place, and time. Gait normal.  Skin: Skin is warm and dry.  Psychiatric: Affect normal.  Vitals reviewed.   Assessment and Plan :   1. Arthralgia, unspecified joint Unclear etiology at this point. Labs pending. DDx includes gout vs. autoimmune disorder. Attempt short course of NSAIDs for 2 weeks. Will contact pt with lab results to discuss further treatment plan.  - CBC with Differential/Platelet - C-reactive protein - Sedimentation rate - ANA - Rheumatoid factor - Uric Acid - Antistreptolysin O titer - TSH - diclofenac (VOLTAREN) 75 MG EC tablet; Take 1 tablet (75 mg total) by mouth 2 (two) times daily.  Dispense: 30 tablet; Refill: 0  Tenna Delaine, PA-C  [facility] [place] 04/04/2017 2:27 PM

## 2017-04-04 NOTE — Patient Instructions (Addendum)
Today we are checking lots of labs for a cause of your pain. We should have those in the next 4-5 days. In the meantime, take diclofenac as prescribed for pain. We do not want you to be on this long term as it is a NSAID which is processed through the kidneys but it is okay to take for the next two weeks. This will treat any underlying inflammation related to possible gout.  We will contact you with your lab results and discuss further treatment plan.    Joint Pain Joint pain can be caused by many things. The joint can be bruised, infected, weak from aging, or sore from exercise. The pain will probably go away if you follow your doctor's instructions for home care. If your joint pain continues, more tests may be needed to help find the cause of your condition. Follow these instructions at home: Watch your condition for any changes. Follow these instructions as told to lessen the pain that you are feeling:  Take medicines only as told by your doctor.  Rest the sore joint for as long as told by your doctor. If your doctor tells you to, raise (elevate) the painful joint above the level of your heart while you are sitting or lying down.  Do not do things that cause pain or make the pain worse.  If told, put ice on the painful area: ? Put ice in a plastic bag. ? Place a towel between your skin and the bag. ? Leave the ice on for 20 minutes, 2-3 times per day.  Wear an elastic bandage, splint, or sling as told by your doctor. Loosen the bandage or splint if your fingers or toes lose feeling (become numb) and tingle, or if they turn cold and blue.  Begin exercising or stretching the joint as told by your doctor. Ask your doctor what types of exercise are safe for you.  Keep all follow-up visits as told by your doctor. This is important.  Contact a doctor if:  Your pain gets worse and medicine does not help it.  Your joint pain does not get better in 3 days.  You have more bruising or  swelling.  You have a fever.  You lose 10 pounds (4.5 kg) or more without trying. Get help right away if:  You are not able to move the joint.  Your fingers or toes become numb or they turn cold and blue. This information is not intended to replace advice given to you by your health care provider. Make sure you discuss any questions you have with your health care provider. Document Released: 09/11/2009 Document Revised: 02/29/2016 Document Reviewed: 07/05/2014 Elsevier Interactive Patient Education  2018 [employer].   IF you received an x-ray today, you will receive an invoice from [facility]. Please contact [facility] at [phone] with questions or concerns regarding your invoice.   IF you received labwork today, you will receive an invoice from [facility]. Please contact [facility] at [phone] with questions or concerns regarding your invoice.   Our billing staff will not be able to assist you with questions regarding bills from these companies.  You will be contacted with the lab results as soon as they are available. The fastest way to get your results is to activate your My Chart account. Instructions are located on the last page of this paperwork. If you have not heard from [place] regarding the results in 2 weeks, please contact this office.

## 2017-04-05 LAB — CBC WITH DIFFERENTIAL/PLATELET
BASOS ABS: 0.2 10*3/uL (ref 0.0–0.2)
Basos: 1 %
EOS (ABSOLUTE): 0.6 10*3/uL — AB (ref 0.0–0.4)
Eos: 5 %
HEMOGLOBIN: 16.6 g/dL (ref 13.0–17.7)
Hematocrit: 47.9 % (ref 37.5–51.0)
Immature Grans (Abs): 0 10*3/uL (ref 0.0–0.1)
Immature Granulocytes: 0 %
LYMPHS ABS: 3.1 10*3/uL (ref 0.7–3.1)
Lymphs: 24 %
MCH: 29.4 pg (ref 26.6–33.0)
MCHC: 34.7 g/dL (ref 31.5–35.7)
MCV: 85 fL (ref 79–97)
MONOCYTES: 6 %
Monocytes Absolute: 0.8 10*3/uL (ref 0.1–0.9)
Neutrophils Absolute: 8.1 10*3/uL — ABNORMAL HIGH (ref 1.4–7.0)
Neutrophils: 64 %
PLATELETS: 223 10*3/uL (ref 150–379)
RBC: 5.65 x10E6/uL (ref 4.14–5.80)
RDW: 15 % (ref 12.3–15.4)
WBC: 12.8 10*3/uL — AB (ref 3.4–10.8)

## 2017-04-05 LAB — RHEUMATOID FACTOR: Rhuematoid fact SerPl-aCnc: <10 IU/mL (ref 0.0–13.9)

## 2017-04-05 LAB — URIC ACID: Uric Acid: 6.2 mg/dL (ref 3.7–8.6)

## 2017-04-05 LAB — TSH: TSH: 1.25 u[IU]/mL (ref 0.450–4.500)

## 2017-04-05 LAB — ANA: Anti Nuclear Antibody(ANA): NEGATIVE

## 2017-04-05 LAB — C-REACTIVE PROTEIN: CRP: 2.2 mg/L (ref 0.0–4.9)

## 2017-04-05 LAB — ANTISTREPTOLYSIN O TITER: ASO: <20 IU/mL (ref 0.0–200.0)

## 2017-04-05 LAB — SEDIMENTATION RATE: SED RATE: 11 mm/h (ref 0–30)

## 2017-04-11 ENCOUNTER — Telehealth: Payer: Self-pay | Admitting: Physician Assistant

## 2017-04-11 NOTE — Telephone Encounter (Signed)
Pt is looking for lab results he states it has been 4 days now   Best number [phone]

## 2017-04-12 NOTE — Telephone Encounter (Signed)
Called, spoke with the patient, informed of results.

## 2017-04-14 ENCOUNTER — Ambulatory Visit (INDEPENDENT_AMBULATORY_CARE_PROVIDER_SITE_OTHER): Payer: BLUE CROSS/BLUE SHIELD | Admitting: Physician Assistant

## 2017-04-14 ENCOUNTER — Encounter: Payer: Self-pay | Admitting: Physician Assistant

## 2017-04-14 VITALS — BP 126/83 | HR 90 | Temp 98.0°F | Resp 18 | Ht 73.86 in | Wt 253.2 lb

## 2017-04-14 DIAGNOSIS — M2550 Pain in unspecified joint: Secondary | ICD-10-CM | POA: Diagnosis not present

## 2017-04-14 MED ORDER — NAPROXEN 500 MG PO TABS: 500.0000 mg | ORAL_TABLET | Freq: Two times a day (BID) | ORAL | 0 refills | Status: DC

## 2017-04-14 NOTE — Progress Notes (Signed)
MRN: [id number] DOB: [birth year]  Subjective:   Steve Keller is a 61 y.o. male presenting for follow up on hand, wrist, and foot pain x 4 weeks. Was initially evaluated by me in office for this on 04/04/17. Pain is described as an intermittent throbbing sensation. Will often notice swelling. Denies fever, chills,redness, warmth, and morning stiffness. Lab work was obtained at that visit. Negative TSH, ASO, uric acid, RF, ANA, sed rate, and CRP. WBC mildly elevated at 12.8. He was started on diclofenac and instructed to return if symptoms persist. Please refer to that OV note for further details. Today, he notes he could not tolerate diclofenac due to diarrhea. He only took two tablets. His pain has persisted. Of note, pt exercises regularly. He walks on the treadmill. At work, pt is on his feet for 12 hours. He also types a lot at work. Has FH of autoimmune diseases in both of his sisters (one has lupus and one has scleroderma).   Steve Keller has a current medication list which includes the following prescription(s): aspirin ec, atorvastatin, blood glucose meter kit and supplies, gabapentin, lisinopril, metformin, and diclofenac, and the following Facility-Administered Medications: sodium chloride. Also has No Known Allergies.  Steve Keller  has a past medical history of Diabetes mellitus without complication ([place]); Hyperlipidemia; and Hypertension. Also  has a past surgical history that includes Multiple extractions with alveoloplasty (N/A, 06/19/2016) and Appendectomy (1983).   Objective:   Vitals: BP 126/83 (BP Location: Right Arm, Patient Position: Sitting, Cuff Size: Large)   Pulse 90   Temp 98 F (36.7 C) (Oral)   Resp 18   Ht 6' 1.86" (1.876 m)   Wt 253 lb 3.2 oz (114.9 kg)   SpO2 98%   BMI 32.63 kg/m   Physical Exam  Constitutional: He is oriented to person, place, and time. He appears well-developed and well-nourished. No distress.  HENT:  Head: Normocephalic and atraumatic.  Eyes:  Conjunctivae are normal.  Neck: Normal range of motion.  Pulmonary/Chest: Effort normal.  Musculoskeletal: Normal range of motion.       Right elbow: No tenderness found.       Left elbow: No tenderness found.       Right wrist: He exhibits bony tenderness.       Left wrist: He exhibits bony tenderness.       Right knee: Normal.       Left knee: Normal.       Right ankle: Tenderness.       Left ankle: Tenderness.       Right hand: He exhibits bony tenderness (with palpation of metacarpals).       Left hand: He exhibits bony tenderness (with palpation of metacarpals).       Right foot: There is bony tenderness (with palpation of metatarsals).       Left foot: There is bony tenderness (with palpation of metatarsals ).  Neurological: He is alert and oriented to person, place, and time.  Skin: Skin is warm and dry.  No erythema or warmth noted on bilateral feet, ankles, wrists, or hands.    Psychiatric: He has a normal mood and affect.  Vitals reviewed.   No results found for this or any previous visit (from the past 24 hour(s)).  Assessment and Plan :  1. Arthralgia, unspecified joint Etiology still remains unclear at this time. DDx includes autoimmune disease vs tendonitis. Will attempt naproxen for 2 weeks. Pt also encouraged to avoid weight bearing exercises and start non  weight bearing exercises like swimming and cycling. Apply ice or heat to affected area daily. Return in two weeks if no improvement, consider referral to rheumatology at that time.  - naproxen (NAPROSYN) 500 MG tablet; Take 1 tablet (500 mg total) by mouth 2 (two) times daily with a meal.  Dispense: 30 tablet; Refill: 0   Steve Delaine, PA-C  [facility] 04/14/2017 11:02 AM

## 2017-04-14 NOTE — Patient Instructions (Addendum)
Take naproxen 500mg  twice daily for the next two weeks. Apply ice/heat to affected areas 4-5 times a day for 20 minutes at a time. I want you to call me in two weeks and let me know how you are doing. If your pain gets worse, call sooner. We may want to consider doing a rheumatology referral if your symptoms worsen or persist at two weeks. I will be thinking about you and your family during this hard time.Thank you for letting me participate in your health and well being.    IF you received an x-ray today, you will receive an invoice from [facility]. Please contact [facility] at [phone] with questions or concerns regarding your invoice.   IF you received labwork today, you will receive an invoice from [facility]. Please contact [facility] at [phone] with questions or concerns regarding your invoice.   Our billing staff will not be able to assist you with questions regarding bills from these companies.  You will be contacted with the lab results as soon as they are available. The fastest way to get your results is to activate your My Chart account. Instructions are located on the last page of this paperwork. If you have not heard from [place] regarding the results in 2 weeks, please contact this office.

## 2017-08-01 ENCOUNTER — Ambulatory Visit (INDEPENDENT_AMBULATORY_CARE_PROVIDER_SITE_OTHER): Payer: BLUE CROSS/BLUE SHIELD | Admitting: Physician Assistant

## 2017-08-01 ENCOUNTER — Encounter: Payer: Self-pay | Admitting: Physician Assistant

## 2017-08-01 VITALS — BP 136/76 | HR 63 | Temp 98.0°F | Resp 18 | Ht 74.29 in | Wt 248.6 lb

## 2017-08-01 DIAGNOSIS — Z114 Encounter for screening for human immunodeficiency virus [HIV]: Secondary | ICD-10-CM

## 2017-08-01 DIAGNOSIS — Z1159 Encounter for screening for other viral diseases: Secondary | ICD-10-CM

## 2017-08-01 DIAGNOSIS — I10 Essential (primary) hypertension: Secondary | ICD-10-CM | POA: Diagnosis not present

## 2017-08-01 DIAGNOSIS — E1140 Type 2 diabetes mellitus with diabetic neuropathy, unspecified: Secondary | ICD-10-CM

## 2017-08-01 LAB — POCT GLYCOSYLATED HEMOGLOBIN (HGB A1C): HEMOGLOBIN A1C: 6.4

## 2017-08-01 MED ORDER — METFORMIN HCL 1000 MG PO TABS: 1000.0000 mg | ORAL_TABLET | Freq: Two times a day (BID) | ORAL | 1 refills | Status: AC

## 2017-08-01 MED ORDER — ATORVASTATIN CALCIUM 20 MG PO TABS: 20.0000 mg | ORAL_TABLET | Freq: Every day | ORAL | 3 refills | Status: AC

## 2017-08-01 MED ORDER — LISINOPRIL 10 MG PO TABS: 10.0000 mg | ORAL_TABLET | Freq: Every day | ORAL | 1 refills | Status: AC

## 2017-08-01 MED ORDER — AMITRIPTYLINE HCL 25 MG PO TABS: 25.0000 mg | ORAL_TABLET | Freq: Every day | ORAL | 1 refills | Status: DC

## 2017-08-01 NOTE — Progress Notes (Deleted)
   Steve Keller  MRN: [id number] DOB: [birth year]  Subjective:  Steve Keller is a 61 y.o. male seen in office today for a chief complaint of ***  Review of Systems  Patient Active Problem List   Diagnosis Date Noted  . Type 2 diabetes mellitus ([place]) 02/12/2017  . Chronic periodontitis 06/19/2016  . Polycythemia 06/16/2016  . Tobacco use disorder 06/16/2016    Current Outpatient Prescriptions on File Prior to Visit  Medication Sig Dispense Refill  . naproxen (NAPROSYN) 500 MG tablet Take 1 tablet (500 mg total) by mouth 2 (two) times daily with a meal. 30 tablet 0  . aspirin EC 81 MG tablet Take 81 mg by mouth daily.    Marland Kitchen atorvastatin (LIPITOR) 20 MG tablet Take 1 tablet (20 mg total) by mouth daily. 90 tablet 3  . blood glucose meter kit and supplies Dispense based on patient and insurance preference. Use up to four times daily as directed. (FOR ICD-9 250.00, 250.01). 1 each 0  . diclofenac (VOLTAREN) 75 MG EC tablet Take 1 tablet (75 mg total) by mouth 2 (two) times daily. (Patient not taking: Reported on 04/14/2017) 30 tablet 0  . gabapentin (NEURONTIN) 100 MG capsule Titrate up slowly to 325m three times a day. 270 capsule 5  . lisinopril (PRINIVIL,ZESTRIL) 10 MG tablet Take 1 tablet (10 mg total) by mouth daily. 90 tablet 1  . metFORMIN (GLUCOPHAGE) 1000 MG tablet Take 1 tablet (1,000 mg total) by mouth 2 (two) times daily with a meal. 180 tablet 1   Current Facility-Administered Medications on File Prior to Visit  Medication Dose Route Frequency Provider Last Rate Last Dose  . 0.9 %  sodium chloride infusion  500 mL Intravenous Continuous Nandigam, Kavitha V, MD        No Known Allergies   Objective:  BP 136/76 (BP Location: Left Arm, Patient Position: Sitting, Cuff Size: Large)   Pulse 63   Temp 98 F (36.7 C) (Oral)   Resp 18   Ht 6' 2.29" (1.887 m)   Wt 248 lb 9.6 oz (112.8 kg)   SpO2 97%   BMI 31.67 kg/m   Physical Exam  Assessment and Plan :  *** There  are no diagnoses linked to this encounter.   BTenna DelainePA-C  [facility] 08/01/2017 8:07 AM

## 2017-08-01 NOTE — Progress Notes (Signed)
MRN: [id number]  Subjective:   Steve Keller is a 61 y.o. male who presents for follow up of Type 2 diabetes mellitus.  Diagnosis was made 06/2016. Patient is currently managed with Continued metformin 1022m BID, which has been effective. Admits good compliance.  Denies adverse effects including metallic taste, hypoglycemia, nausea, vomiting. Patient is not checking home blood sugars. Home blood sugar records: patient does not check sugars. Current symptoms include continued burning in bilateral feet, not extending into the calf. He has tried gabapentin 9069mat night for the past few months and notes he does get relief at night time but he cannot take it during the day due to drowsineess and notes the burning returns throughout the day and is very irritating. Has been out of gabapentin 9001m day for over a week.  Patient denies foot ulcerations, hyperglycemia, increased appetite, nausea, polydipsia, polyuria, visual disturbances, vomiting, weight loss and numbness of feet. Patient is checking their feet daily. Last diabetic eye exam eye exam a few months ago, no diabetic retinopathy.   Diet: pt still doing nutrisystem diet but not as strictly. He eats mostly baked foods. Drinks mostly water. Will drink sweet tea occassionally. Smokes 1 pack per 3 days.  Known diabetic complications: peripheral neuropathy Immunizations: Flu vaccine: never, pneumococal vaccine: 09/04/16  Other concerns: Also needs refills for HLD and HTN medication. 1) HTN: Controlled on lisinopril 83m26mily. He does not check bp off . Has been out of lisinopril since last week. Denies lightheadedness, dizziness, chronic headache, double vision, chest pain, shortness of breath, heart racing, palpitations, nausea, vomiting, abdominal pain, and hematuria. No FH of heart disease.    Review of Systems  Constitutional: Negative for chills, diaphoresis and fever.  Psychiatric/Behavioral: Negative for depression, hallucinations  and suicidal ideas. The patient is not nervous/anxious.      Objective:   PHYSICAL EXAM BP 136/76 (BP Location: Left Arm, Patient Position: Sitting, Cuff Size: Large)   Pulse 63   Temp 98 F (36.7 C) (Oral)   Resp 18   Ht 6' 2.29" (1.887 m)   Wt 248 lb 9.6 oz (112.8 kg)   SpO2 97%   BMI 31.67 kg/m   Physical Exam  Constitutional: He is oriented to person, place, and time. He appears well-developed and well-nourished. No distress.  HENT:  Head: Normocephalic and atraumatic.  Mouth/Throat: Uvula is midline, oropharynx is clear and moist and mucous membranes are normal. No tonsillar exudate.  Eyes: Pupils are equal, round, and reactive to light. Conjunctivae are normal.  Neck: Normal range of motion.  Cardiovascular: Normal rate, regular rhythm, normal heart sounds and intact distal pulses.   Pulmonary/Chest: Effort normal and breath sounds normal. He has no wheezes. He has no rales.  Abdominal: Soft. Bowel sounds are normal. There is no tenderness.  Musculoskeletal:       Right lower leg: He exhibits no swelling.       Left lower leg: He exhibits no swelling.  Neurological: He is alert and oriented to person, place, and time.  Skin: Skin is warm and dry.  Psychiatric: He has a normal mood and affect.  Vitals reviewed.   Diabetic Foot Exam - Simple   Simple Foot Form Visual Inspection No deformities, no ulcerations, no other skin breakdown bilaterally:  Yes Sensation Testing Intact to touch and monofilament testing bilaterally:  Yes Pulse Check Posterior Tibialis and Dorsalis pulse intact bilaterally:  Yes Comments     Results for orders placed or performed in visit on  08/01/17 (from the past 24 hour(s))  POCT glycosylated hemoglobin (Hb A1C)     Status: Normal   Collection Time: 08/01/17  8:51 AM  Result Value Ref Range   Hemoglobin A1C 6.4     Wt Readings from Last 3 Encounters:  08/01/17 248 lb 9.6 oz (112.8 kg)  04/14/17 253 lb 3.2 oz (114.9 kg)  04/04/17  247 lb 12.8 oz (112.4 kg)    Assessment and Plan :  1. Type 2 diabetes mellitus with diabetic neuropathy, without long-term current use of insulin (HCC) Well controlled at this time. Pt congratulated on lifestyle modifications and encouraged to continue doing them as he is as he is clearly seeing results. If A1C still this controlled at follow up visit in 6 months, consider lowering metformin dose. For diabetic neuropathy, d/c gabapentin (he has already tapered himself over the past week by being off of it) and start amitriptyline at night. Will start at low dose and titrate up slowly as needed. Pt educated on potential side effects of amitriptyline, seek care immediately if he develops any suicidal thoughts. If no improvement in 2-3 weeks, contact office to discuss increasing dose. Otherwise, follow up in 6 months.  - Lipid panel - POCT glycosylated hemoglobin (Hb A1C) - Microalbumin/Creatinine Ratio, Urine - CMP14+EGFR - HM Diabetes Foot Exam - metFORMIN (GLUCOPHAGE) 1000 MG tablet; Take 1 tablet (1,000 mg total) by mouth 2 (two) times daily with a meal.  Dispense: 180 tablet; Refill: 1 - atorvastatin (LIPITOR) 20 MG tablet; Take 1 tablet (20 mg total) by mouth daily.  Dispense: 90 tablet; Refill: 3 - amitriptyline (ELAVIL) 25 MG tablet; Take 1 tablet (25 mg total) by mouth at bedtime.  Dispense: 90 tablet; Refill: 1  2. Essential hypertension Well controlled. Continue current lisinopril dose. Follow up in 6 months. Obtain EKG at that visit as last EKG was 06/2016.  - CBC with Differential/Platelet - lisinopril (PRINIVIL,ZESTRIL) 10 MG tablet; Take 1 tablet (10 mg total) by mouth daily.  Dispense: 90 tablet; Refill: 1  3. Screening for HIV (human immunodeficiency virus) - HIV antibody  4. Encounter for hepatitis C screening test for low risk patient - Hepatitis C antibody  Tenna Delaine, PA-C  [facility] [place] 08/01/2017 9:16 AM

## 2017-08-01 NOTE — Patient Instructions (Addendum)
For overall health, keep up the good work! Your A1C was 6.4, which is great! I am very proud of you. If you continue to be this controlled, we may discuss lowering your metformin dose at your next visit.   When you get a chance, make sure you have the ophthalmologist send over your records from your last visit.    For diabetic neuropathy,  I recommend we start an antidepressant called amitriptyline. Please keep in mind that when you start an antidepressant it can worsen depression and anxiety symptoms. It can also increase risk of suicidal thoughts so if this occurs, please seek care immediately. Common side effects can include, but are not limited to, GI upset, nausea, vomiting,tachycardia, urinary frequency, and drowsiness.   Follow up in 6 months. Return sooner if feet sx worsen. c   Diabetic Neuropathy Diabetic neuropathy is a nerve disease or nerve damage that is caused by diabetes mellitus. About half of all people with diabetes mellitus have some form of nerve damage. Nerve damage is more common in those who have had diabetes mellitus for many years and who generally have not had good control of their blood sugar (glucose) level. Diabetic neuropathy is a common complication of diabetes mellitus. There are three common types of diabetic neuropathy and a fourth type that is less common and less understood:  Peripheral neuropathy-This is the most common type of diabetic neuropathy. It causes damage to the nerves of the feet and legs first and then eventually the hands and arms. The damage affects the ability to sense touch.  Autonomic neuropathy-This type causes damage to the autonomic nervous system, which controls the following functions: ? Heartbeat. ? Body temperature. ? Blood pressure. ? Urination. ? Digestion. ? Sweating. ? Sexual function.  Focal neuropathy-Focal neuropathy can be painful and unpredictable and occurs most often in older adults with diabetes mellitus. It involves a  specific nerve or one area and often comes on suddenly. It usually does not cause long-term problems.  Radiculoplexus neuropathy- Sometimes called lumbosacral radiculoplexus neuropathy, radiculoplexus neuropathy affects the nerves of the thighs, hips, buttocks, or legs. It is more common in people with type 2 diabetes mellitus and in older men. It is characterized by debilitating pain, weakness, and atrophy, usually in the thigh muscles.  What are the causes? The cause of peripheral, autonomic, and focal neuropathies is diabetes mellitus that is uncontrolled and high glucose levels. The cause of radiculoplexus neuropathy is unknown. However, it is thought to be caused by inflammation related to uncontrolled glucose levels. What are the signs or symptoms? Peripheral Neuropathy Peripheral neuropathy develops slowly over time. When the nerves of the feet and legs no longer work there may be:  Burning, stabbing, or aching pain in the legs or feet.  Inability to feel pressure or pain in your feet. This can lead to: ? Thick calluses over pressure areas. ? Pressure sores. ? Ulcers.  Foot deformities.  Reduced ability to feel temperature changes.  Muscle weakness.  Autonomic Neuropathy The symptoms of autonomic neuropathy vary depending on which nerves are affected. Symptoms may include:  Problems with digestion, such as: ? Feeling sick to your stomach (nausea). ? Vomiting. ? Bloating. ? Constipation. ? Diarrhea. ? Abdominal pain.  Difficulty with urination. This occurs if you lose your ability to sense when your bladder is full. Problems include: ? Urine leakage (incontinence). ? Inability to empty your bladder completely (retention).  Rapid or irregular heartbeat (palpitations).  Blood pressure drops when you stand up (orthostatic  hypotension). When you stand up you may feel: ? Dizzy. ? Weak. ? Faint.  In men, inability to attain and maintain an erection.  In women, vaginal  dryness and problems with decreased sexual desire and arousal.  Problems with body temperature regulation.  Increased or decreased sweating.  Focal Neuropathy  Abnormal eye movements or abnormal alignment of both eyes.  Weakness in the wrist.  Foot drop. This results in an inability to lift the foot properly and abnormal walking or foot movement.  Paralysis on one side of your face (Bell palsy).  Chest or abdominal pain. Radiculoplexus Neuropathy  Sudden, severe pain in your hip, thigh, or buttocks.  Weakness and wasting of thigh muscles.  Difficulty rising from a seated position.  Abdominal swelling.  Unexplained weight loss (usually more than 10 lb [4.5 kg]). How is this diagnosed? Peripheral Neuropathy Your senses may be tested. Sensory function testing can be done with:  A light touch using a monofilament.  A vibration with tuning fork.  A sharp sensation with a pin prick.  Other tests that can help diagnose neuropathy are:  Nerve conduction velocity. This test checks the transmission of an electrical current through a nerve.  Electromyography. This shows how muscles respond to electrical signals transmitted by nearby nerves.  Quantitative sensory testing. This is used to assess how your nerves respond to vibrations and changes in temperature.  Autonomic Neuropathy Diagnosis is often based on reported symptoms. Tell your health care provider if you experience:  Dizziness.  Constipation.  Diarrhea.  Inappropriate urination or inability to urinate.  Inability to get or maintain an erection.  Tests that may be done include:  Electrocardiography or Holter monitor. These are tests that can help show problems with the heart rate or heart rhythm.  An X-ray exam may be done.  Focal Neuropathy Diagnosis is made based on your symptoms and what your health care provider finds during your exam. Other tests may be done. They may include:  Nerve conduction  velocities. This checks the transmission of electrical current through a nerve.  Electromyography. This shows how muscles respond to electrical signals transmitted by nearby nerves.  Quantitative sensory testing. This test is used to assess how your nerves respond to vibration and changes in temperature.  Radiculoplexus Neuropathy  Often the first thing is to eliminate any other issue or problems that might be the cause, as there is no standard test for diagnosis.  X-ray exam of your spine and lumbar region.  Spinal tap to rule out cancer.  MRI to rule out other lesions. How is this treated? Once nerve damage occurs, it cannot be reversed. The goal of treatment is to keep the disease or nerve damage from getting worse and affecting more nerve fibers. Controlling your blood glucose level is the key. Most people with radiculoplexus neuropathy see at least a partial improvement over time. You will need to keep your blood glucose and HbA1c levels in the target range determined by your health care provider. Things that help control blood glucose levels include:  Blood glucose monitoring.  Meal planning.  Physical activity.  Diabetes medicine.  Over time, maintaining lower blood glucose levels helps lessen symptoms. Sometimes, prescription pain medicine is needed. Follow these instructions at home:  Do not smoke.  Keep your blood glucose level in the range that you and your health care provider have determined acceptable for you.  Keep your blood pressure level in the range that you and your health care provider have determined  acceptable for you.  Eat a well-balanced diet.  Be physically active every day. Include strength training and balance exercises.  Protect your feet. ? Check your feet every day for sores, cuts, blisters, or signs of infection. ? Wear padded socks and supportive shoes. Use orthotic inserts, if necessary. ? Regularly check the insides of your shoes for worn  spots. Make sure there are no rocks or other items inside your shoes before you put them on. Contact a health care provider if:  You have burning, stabbing, or aching pain in the legs or feet.  You are unable to feel pressure or pain in your feet.  You develop problems with digestion such as: ? Nausea. ? Vomiting. ? Bloating. ? Constipation. ? Diarrhea. ? Abdominal pain.  You have difficulty with urination, such as: ? Incontinence. ? Retention.  You have palpitations.  You develop orthostatic hypotension. When you stand up you may feel: ? Dizzy. ? Weak. ? Faint.  You cannot attain and maintain an erection (in men).  You have vaginal dryness and problems with decreased sexual desire and arousal (in women).  You have severe pain in your thighs, legs, or buttocks.  You have unexplained weight loss. This information is not intended to replace advice given to you by your health care provider. Make sure you discuss any questions you have with your health care provider. Document Released: 12/02/2001 Document Revised: 02/29/2016 Document Reviewed: 03/04/2013 Elsevier Interactive Patient Education  2017 [employer].   IF you received an x-ray today, you will receive an invoice from [facility]. Please contact [facility] at [phone] with questions or concerns regarding your invoice.   IF you received labwork today, you will receive an invoice from [facility]. Please contact [facility] at [phone] with questions or concerns regarding your invoice.   Our billing staff will not be able to assist you with questions regarding bills from these companies.  You will be contacted with the lab results as soon as they are available. The fastest way to get your results is to activate your My Chart account. Instructions are located on the last page of this paperwork. If you have not heard from [place] regarding the results in 2 weeks, please contact this office.

## 2017-08-02 LAB — CMP14+EGFR
ALT: 34 IU/L (ref 0–44)
AST: 17 IU/L (ref 0–40)
Albumin/Globulin Ratio: 1.7 (ref 1.2–2.2)
Albumin: 4.5 g/dL (ref 3.6–4.8)
Alkaline Phosphatase: 64 IU/L (ref 39–117)
BUN/Creatinine Ratio: 10 (ref 10–24)
BUN: 10 mg/dL (ref 8–27)
Bilirubin Total: 0.3 mg/dL (ref 0.0–1.2)
CO2: 24 mmol/L (ref 20–29)
Calcium: 9.8 mg/dL (ref 8.6–10.2)
Chloride: 101 mmol/L (ref 96–106)
Creatinine, Ser: 1.02 mg/dL (ref 0.76–1.27)
GFR calc Af Amer: 91 mL/min/1.73 (ref >59)
GFR calc non Af Amer: 78 mL/min/1.73 (ref >59)
Globulin, Total: 2.7 g/dL (ref 1.5–4.5)
Glucose: 139 mg/dL — ABNORMAL HIGH (ref 65–99)
Potassium: 4.7 mmol/L (ref 3.5–5.2)
Sodium: 141 mmol/L (ref 134–144)
Total Protein: 7.2 g/dL (ref 6.0–8.5)

## 2017-08-02 LAB — CBC WITH DIFFERENTIAL/PLATELET
Basophils Absolute: 0.1 10*3/uL (ref 0.0–0.2)
Basos: 1 %
EOS (ABSOLUTE): 0.9 10*3/uL — ABNORMAL HIGH (ref 0.0–0.4)
EOS: 9 %
HEMATOCRIT: 50.7 % (ref 37.5–51.0)
HEMOGLOBIN: 17 g/dL (ref 13.0–17.7)
Immature Grans (Abs): 0 10*3/uL (ref 0.0–0.1)
Immature Granulocytes: 0 %
LYMPHS ABS: 2.7 10*3/uL (ref 0.7–3.1)
Lymphs: 25 %
MCH: 29 pg (ref 26.6–33.0)
MCHC: 33.5 g/dL (ref 31.5–35.7)
MCV: 87 fL (ref 79–97)
MONOCYTES: 8 %
Monocytes Absolute: 0.8 10*3/uL (ref 0.1–0.9)
Neutrophils Absolute: 5.9 10*3/uL (ref 1.4–7.0)
Neutrophils: 57 %
Platelets: 202 10*3/uL (ref 150–379)
RBC: 5.86 x10E6/uL — AB (ref 4.14–5.80)
RDW: 14.1 % (ref 12.3–15.4)
WBC: 10.5 10*3/uL (ref 3.4–10.8)

## 2017-08-02 LAB — HIV ANTIBODY (ROUTINE TESTING W REFLEX): HIV Screen 4th Generation wRfx: NONREACTIVE

## 2017-08-02 LAB — MICROALBUMIN / CREATININE URINE RATIO
Creatinine, Urine: 149.8 mg/dL
MICROALB/CREAT RATIO: 30 mg/g{creat} (ref 0.0–30.0)
MICROALBUM., U, RANDOM: 45 ug/mL

## 2017-08-02 LAB — LIPID PANEL
CHOLESTEROL TOTAL: 165 mg/dL (ref 100–199)
Chol/HDL Ratio: 4.5 ratio (ref 0.0–5.0)
HDL: 37 mg/dL — AB (ref >39)
LDL Calculated: 99 mg/dL (ref 0–99)
TRIGLYCERIDES: 146 mg/dL (ref 0–149)
VLDL Cholesterol Cal: 29 mg/dL (ref 5–40)

## 2017-08-02 LAB — HEPATITIS C ANTIBODY: Hep C Virus Ab: <0.1 {s_co_ratio} (ref 0.0–0.9)

## 2017-08-06 ENCOUNTER — Encounter: Payer: Self-pay | Admitting: Radiology

## 2017-09-09 ENCOUNTER — Telehealth: Payer: Self-pay | Admitting: Physician Assistant

## 2017-09-09 NOTE — Telephone Encounter (Signed)
Copied from [place] [phone]. Topic: Quick Communication - See Telephone Encounter >> Sep 09, 2017 12:19 PM Cleaster Corin, NT wrote: CRM for notification. See Telephone encounter for:   09/09/17. Pt. Called to let Dr. Timmothy Euler know that the medication that she prescribed (elavil)for his feet isn't working. Pt. Has been having pain for 8 days now. Was trying to give the medication time to work but still isn't helping. Pt. Asked if Dr. Timmothy Euler can call in something else or should he make another appt. To se her. Please call pt. At ([address], [address]. [place] [place] [address] Phone: [phone] Fax: [phone] Not a 24 hour pharmacy; exact hours not known

## 2017-09-11 NOTE — Telephone Encounter (Signed)
Please call patient and have him follow-up in office.

## 2017-09-13 NOTE — Telephone Encounter (Signed)
Please schedule

## 2017-09-16 ENCOUNTER — Ambulatory Visit: Payer: Self-pay

## 2017-09-16 NOTE — Telephone Encounter (Signed)
   Reason for Disposition . [1] MODERATE pain (e.g., interferes with normal activities, limping) AND [2] present > 3 days  Answer Assessment - Initial Assessment Questions 1. ONSET: "When did the pain start?"      Started last week 2. LOCATION: "Where is the pain located?"      Both feet - top and arch of feet are burning 3. PAIN: "How bad is the pain?"    (Scale 1-10; or mild, moderate, severe)   -  MILD (1-3): doesn't interfere with normal activities    -  MODERATE (4-7): interferes with normal activities (e.g., work or school) or awakens from sleep, limping    -  SEVERE (8-10): excruciating pain, unable to do any normal activities, unable to walk     8 4. WORK OR EXERCISE: "Has there been any recent work or exercise that involved this part of the body?"      This is neuropathy 5. CAUSE: "What do you think is causing the foot pain?"     Neuropathy 6. OTHER SYMPTOMS: "Do you have any other symptoms?" (e.g., leg pain, rash, fever, numbness)     No 7. PREGNANCY: "Is there any chance you are pregnant?" "When was your last menstrual period?"     No  Protocols used: FOOT PAIN-A-AH  Pt. States the Elavil that was prescribed is not helping. He states the burning sensation is worse during the day. Appointment made for Friday - pt. Is going out of town today.

## 2017-09-19 ENCOUNTER — Ambulatory Visit: Payer: BLUE CROSS/BLUE SHIELD | Admitting: Emergency Medicine

## 2017-09-19 ENCOUNTER — Encounter: Payer: Self-pay | Admitting: Emergency Medicine

## 2017-09-19 VITALS — BP 122/72 | HR 89 | Temp 98.1°F | Resp 17 | Ht 73.5 in | Wt 259.0 lb

## 2017-09-19 DIAGNOSIS — E1140 Type 2 diabetes mellitus with diabetic neuropathy, unspecified: Secondary | ICD-10-CM

## 2017-09-19 DIAGNOSIS — M79672 Pain in left foot: Secondary | ICD-10-CM

## 2017-09-19 DIAGNOSIS — R203 Hyperesthesia: Secondary | ICD-10-CM | POA: Insufficient documentation

## 2017-09-19 DIAGNOSIS — M79671 Pain in right foot: Secondary | ICD-10-CM

## 2017-09-19 NOTE — Progress Notes (Signed)
Steve Keller 60 y.o.   Chief Complaint  Patient presents with  . Foot Pain    neuropathy     HISTORY OF PRESENT ILLNESS: This is a 61 y.o. male complaining of bilateral foot burning and pain x several months. Denies trauma.  HPI   Prior to Admission medications   Medication Sig Start Date End Date Taking? Authorizing Provider  amitriptyline (ELAVIL) 25 MG tablet Take 1 tablet (25 mg total) by mouth at bedtime. 08/01/17  Yes Leonie Douglas, PA-C  aspirin EC 81 MG tablet Take 81 mg by mouth daily.   Yes [provider]  atorvastatin (LIPITOR) 20 MG tablet Take 1 tablet (20 mg total) by mouth daily. 08/01/17  Yes Tenna Delaine D, PA-C  blood glucose meter kit and supplies Dispense based on patient and insurance preference. Use up to four times daily as directed. (FOR ICD-9 250.00, 250.01). 06/18/16  Yes Vann, Jessica U, DO  diclofenac (VOLTAREN) 75 MG EC tablet Take 1 tablet (75 mg total) by mouth 2 (two) times daily. 04/04/17  Yes Timmothy Euler, [place] D, PA-C  lisinopril (PRINIVIL,ZESTRIL) 10 MG tablet Take 1 tablet (10 mg total) by mouth daily. 08/01/17  Yes Tenna Delaine D, PA-C  metFORMIN (GLUCOPHAGE) 1000 MG tablet Take 1 tablet (1,000 mg total) by mouth 2 (two) times daily with a meal. 08/01/17  Yes Timmothy Euler, [place] D, PA-C  naproxen (NAPROSYN) 500 MG tablet Take 1 tablet (500 mg total) by mouth 2 (two) times daily with a meal. Patient not taking: Reported on 09/19/2017 04/14/17   Tenna Delaine D, PA-C    No Known Allergies  Patient Active Problem List   Diagnosis Date Noted  . Type 2 diabetes mellitus ([place]) 02/12/2017  . Chronic periodontitis 06/19/2016  . Polycythemia 06/16/2016  . Tobacco use disorder 06/16/2016    Past Medical History:  Diagnosis Date  . Diabetes mellitus without complication ([place])    diagnosed 06/2016   . Hyperlipidemia   . Hypertension     Past Surgical History:  Procedure Laterality Date  . APPENDECTOMY  1983  .  MULTIPLE EXTRACTIONS WITH ALVEOLOPLASTY N/A 06/19/2016   Procedure: EXTRACTION OF TOOTH #'S 2,4,5,13,14,15,20,21,AND 30 WITH ALVEOLOPLASTY AND GROSS DEBRIDEMENT OF REMAINING TEETH;  Surgeon: [name], DDS;  Location: [facility];  Service: Oral Surgery;  Laterality: N/A;  Throat pack in: 0853 Throat pack out: 1015    Social History   Socioeconomic History  . Marital status: Single    Spouse name: Not on file  . Number of children: Not on file  . Years of education: Not on file  . Highest education level: Not on file  Social Needs  . Financial resource strain: Not on file  . Food insecurity - worry: Not on file  . Food insecurity - inability: Not on file  . Transportation needs - medical: Not on file  . Transportation needs - non-medical: Not on file  Occupational History  . Not on file  Tobacco Use  . Smoking status: Current Every Day Smoker    Packs/day: 0.15    Years: 44.00    Pack years: 6.60    Types: Cigarettes  . Smokeless tobacco: Never Used  Substance and Sexual Activity  . Alcohol use: No  . Drug use: No  . Sexual activity: Not on file  Other Topics Concern  . Not on file  Social History Narrative  . Not on file    Family History  Problem Relation Age of Onset  . CVA  Father   . Diabetes Mellitus II Father   . Heart disease Mother   . Other Sister        immune disease  . Lupus Sister   . Colon cancer Neg Hx      Review of Systems  Constitutional: Negative.  Negative for chills, fever and weight loss.  HENT: Negative.   Eyes: Negative.   Respiratory: Negative.  Negative for cough and hemoptysis.   Cardiovascular: Positive for claudication. Negative for chest pain and palpitations.  Gastrointestinal: Negative.  Negative for abdominal pain, nausea and vomiting.  Genitourinary: Negative.  Negative for dysuria and hematuria.  Musculoskeletal: Negative.  Negative for joint pain and myalgias.  Skin: Negative.  Negative for rash.  Neurological: Negative  for dizziness, sensory change, focal weakness and headaches.  Endo/Heme/Allergies: Negative.   All other systems reviewed and are negative.  Vitals:   09/19/17 0950  BP: 122/72  Pulse: 89  Resp: 17  Temp: 98.1 F (36.7 C)  SpO2: 98%     Physical Exam  Constitutional: He is oriented to person, place, and time. He appears well-developed and well-nourished.  HENT:  Head: Normocephalic and atraumatic.  Mouth/Throat: Oropharynx is clear and moist.  Eyes: Conjunctivae and EOM are normal. Pupils are equal, round, and reactive to light.  Neck: Normal range of motion. Neck supple.  Cardiovascular: Normal rate and regular rhythm.  Pulmonary/Chest: Effort normal and breath sounds normal.  Musculoskeletal:  LE: +2 DP and PT bilaterally; feet warm to touch with good capillary refill; very sensitive to touch distally; suspected nerve pain.  Neurological: He is alert and oriented to person, place, and time. He exhibits normal muscle tone.  Skin: Skin is warm. Capillary refill takes less than 2 seconds.  Psychiatric: He has a normal mood and affect. His behavior is normal.  Vitals reviewed.    ASSESSMENT & PLAN: Steve Keller was seen today for foot pain.  Diagnoses and all orders for this visit:  Foot pain, bilateral -     Ambulatory referral to Podiatry -     VAS [place] LOWER EXTREMITY ARTERIAL DUPLEX; Future  Type 2 diabetes mellitus with diabetic neuropathy, without long-term current use of insulin ([place]) -     Ambulatory referral to Neurology -     VAS [place]; Future  Hyperesthesia    Patient Instructions       IF you received an x-ray today, you will receive an invoice from [facility]. Please contact [facility] at [phone] with questions or concerns regarding your invoice.   IF you received labwork today, you will receive an invoice from [facility]. Please contact [facility] at [phone] with questions or concerns regarding your  invoice.   Our billing staff will not be able to assist you with questions regarding bills from these companies.  You will be contacted with the lab results as soon as they are available. The fastest way to get your results is to activate your My Chart account. Instructions are located on the last page of this paperwork. If you have not heard from [place] regarding the results in 2 weeks, please contact this office.      Foot Pain Many things can cause foot pain. Some common causes are:  An injury.  A sprain.  Arthritis.  Blisters.  Bunions.  Follow these instructions at home: Pay attention to any changes in your symptoms. Take these actions to help with your discomfort:  If directed, put ice on the affected area: ?  Put ice in a plastic bag. ? Place a towel between your skin and the bag. ? Leave the ice on for 15-20 minutes, 3?4 times a day for 2 days.  Take over-the-counter and prescription medicines only as told by your health care provider.  Wear comfortable, supportive shoes that fit you well. Do not wear high heels.  Do not stand or walk for long periods of time.  Do not lift a lot of weight. This can put added pressure on your feet.  Do stretches to relieve foot pain and stiffness as told by your health care provider.  Rub your foot gently.  Keep your feet clean and dry.  Contact a health care provider if:  Your pain does not get better after a few days of self-care.  Your pain gets worse.  You cannot stand on your foot. Get help right away if:  Your foot is numb or tingling.  Your foot or toes are swollen.  Your foot or toes turn white or blue.  You have warmth and redness along your foot. This information is not intended to replace advice given to you by your health care provider. Make sure you discuss any questions you have with your health care provider. Document Released: 10/20/2015 Document Revised: 02/29/2016 Document Reviewed: 10/19/2014 Elsevier  Interactive Patient Education  2018 [employer].      Agustina Caroli, MD [facility]

## 2017-09-19 NOTE — Patient Instructions (Addendum)
     IF you received an x-ray today, you will receive an invoice from [facility]. Please contact [facility] at [phone] with questions or concerns regarding your invoice.   IF you received labwork today, you will receive an invoice from [facility]. Please contact [facility] at [phone] with questions or concerns regarding your invoice.   Our billing staff will not be able to assist you with questions regarding bills from these companies.  You will be contacted with the lab results as soon as they are available. The fastest way to get your results is to activate your My Chart account. Instructions are located on the last page of this paperwork. If you have not heard from us regarding the results in 2 weeks, please contact this office.     Foot Pain Many things can cause foot pain. Some common causes are:  An injury.  A sprain.  Arthritis.  Blisters.  Bunions.  Follow these instructions at home: Pay attention to any changes in your symptoms. Take these actions to help with your discomfort:  If directed, put ice on the affected area: ? Put ice in a plastic bag. ? Place a towel between your skin and the bag. ? Leave the ice on for 15-20 minutes, 3?4 times a day for 2 days.  Take over-the-counter and prescription medicines only as told by your health care provider.  Wear comfortable, supportive shoes that fit you well. Do not wear high heels.  Do not stand or walk for long periods of time.  Do not lift a lot of weight. This can put added pressure on your feet.  Do stretches to relieve foot pain and stiffness as told by your health care provider.  Rub your foot gently.  Keep your feet clean and dry.  Contact a health care provider if:  Your pain does not get better after a few days of self-care.  Your pain gets worse.  You cannot stand on your foot. Get help right away if:  Your foot is numb or tingling.  Your foot or toes are  swollen.  Your foot or toes turn white or blue.  You have warmth and redness along your foot. This information is not intended to replace advice given to you by your health care provider. Make sure you discuss any questions you have with your health care provider. Document Released: 10/20/2015 Document Revised: 02/29/2016 Document Reviewed: 10/19/2014 Elsevier Interactive Patient Education  2018 [employer]  

## 2017-09-23 ENCOUNTER — Telehealth: Payer: Self-pay | Admitting: Physician Assistant

## 2017-09-23 NOTE — Telephone Encounter (Signed)
[facility], called in to have orders updated in system. She says that pt have to have a AVI first, then they are able to check the pt's lower extremities (current order)    CB: if needed, [phone]

## 2017-09-26 ENCOUNTER — Other Ambulatory Visit: Payer: Self-pay | Admitting: Emergency Medicine

## 2017-09-26 NOTE — Telephone Encounter (Signed)
Please specify test, ?AVI. Thanks.

## 2017-09-29 NOTE — Telephone Encounter (Signed)
See message below °

## 2017-10-01 ENCOUNTER — Encounter: Payer: Self-pay | Admitting: Podiatry

## 2017-10-01 ENCOUNTER — Ambulatory Visit: Payer: BLUE CROSS/BLUE SHIELD | Admitting: Podiatry

## 2017-10-01 DIAGNOSIS — M6701 Short Achilles tendon (acquired), right ankle: Secondary | ICD-10-CM | POA: Diagnosis not present

## 2017-10-01 DIAGNOSIS — M2550 Pain in unspecified joint: Secondary | ICD-10-CM | POA: Diagnosis not present

## 2017-10-01 DIAGNOSIS — M722 Plantar fascial fibromatosis: Secondary | ICD-10-CM

## 2017-10-01 DIAGNOSIS — M216X9 Other acquired deformities of unspecified foot: Secondary | ICD-10-CM

## 2017-10-01 MED ORDER — NAPROXEN 500 MG PO TABS: 500.0000 mg | ORAL_TABLET | Freq: Two times a day (BID) | ORAL | 2 refills | Status: AC

## 2017-10-01 MED ORDER — TRAMADOL HCL 50 MG PO TABS
50.0000 mg | ORAL_TABLET | Freq: Three times a day (TID) | ORAL | 0 refills | Status: DC | PRN
Start: 2017-10-01 — End: 2017-11-19

## 2017-10-01 NOTE — Patient Instructions (Signed)
Seen for bilateral foot pain. Noted of tight Achilles tendon on right, high arched foot with excess stress in instep area due to abnormal biomechanics. Medication ordered, Naprosyn and Tramadol to take one of each per day. Return in one month for possible Orthotic preparation.

## 2017-10-01 NOTE — Progress Notes (Signed)
SUBJECTIVE: 61 y.o. year old male presents complaining of painful feet since September 2018. Pain is getting worse over time. Last A1c was 6.4. Been diabetic x 2 years. 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 Diagnosed with Peripheral neuropathy 2 years ago.  Review of Systems  Constitutional: Negative.   HENT: Negative.   Eyes: Negative.   Respiratory: Negative.   Cardiovascular: Negative.   Gastrointestinal: Negative.   Genitourinary: Negative.   Musculoskeletal: Negative.   Skin: Negative.   Neurological:       Tingling in both feet started 2 years ago.    OBJECTIVE: DERMATOLOGIC EXAMINATION: No abnormal findings.  VASCULAR EXAMINATION OF LOWER LIMBS: All pedal pulses are palpable with normal pulsation.   NEUROLOGIC EXAMINATION OF THE LOWER LIMBS: Achilles DTR is present and within normal. Monofilament (Semmes-Weinstein 10-gm) sensory testing positive 6 out of 6, bilateral. Vibratory sensations(128Hz  turning fork) intact at medial and lateral forefoot bilateral.  Sharp and Dull discriminatory sensations at the plantar ball of hallux is intact bilateral.   MUSCULOSKELETAL EXAMINATION: High arched cavus foot with tight Achilles tendon on right. Painful instep with weight bearing bilateral. Hypermobile first ray L>R. Rearfoot varus bilateral.  RADIOGRAPHIC FINDINGS: AP View:  Metatarsus adductus bilateral. Bipartated Fibular sesamoid right,  Lateral view:  Elevated first ray bilateral. Plantar calcaneal spur bilateral. Mild midtarsal sagging.   ASSESSMENT: Compensated rearfoot varus bilateral. Plantar fasciitis bilateral. 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  PLAN: Reviewed clinical findings and available treatment options. Naproxyn, Tramadol prescribed. Patient chose to D/C Elavil at night and take Tramadol during the day for pain management. Will prepare for custom orthotics on his next visit in one  month.

## 2017-10-29 ENCOUNTER — Encounter: Payer: Self-pay | Admitting: Podiatry

## 2017-10-29 ENCOUNTER — Ambulatory Visit: Payer: BLUE CROSS/BLUE SHIELD | Admitting: Podiatry

## 2017-10-29 DIAGNOSIS — M2550 Pain in unspecified joint: Secondary | ICD-10-CM | POA: Diagnosis not present

## 2017-10-29 DIAGNOSIS — M216X9 Other acquired deformities of unspecified foot: Secondary | ICD-10-CM

## 2017-10-29 DIAGNOSIS — M722 Plantar fascial fibromatosis: Secondary | ICD-10-CM

## 2017-10-29 DIAGNOSIS — M6701 Short Achilles tendon (acquired), right ankle: Secondary | ICD-10-CM

## 2017-10-29 MED ORDER — MELOXICAM 7.5 MG PO TABS: 7.5000 mg | ORAL_TABLET | Freq: Two times a day (BID) | ORAL | 0 refills | Status: DC

## 2017-10-29 NOTE — Patient Instructions (Signed)
Continue to have bilateral foot pain. Medication added with Meloxicam 7.5 twice daily. Both feet casted for orthotics. Will call when they are ready.

## 2017-10-29 NOTE — Progress Notes (Signed)
Subjective: 62 y.o. year old male patient presents requesting custom orthotics prepared. Diabetic under control. 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 Diagnosed with Peripheral neuropathy. Failed to improve with Gabapentin.  Stated that he failed to see any improvement with Ultram and Naproxen that was prescribed last month.   Objective: Dermatologic: No abnormal findings. Vascular: Pedal pulses are all palpable. Orthopedic: High arched cavus foot with tight Achilles tendon on right. Painful arch with weight bearing. Hypermobile first ray L>R, rearfoot varus bilateral. Neurologic: Subjective burning pain sporadic.   Assessment: Compensated rearfoot varus bilateral. Plantar fasciitis bilateral. Cavovarus bilateral. Contracture right Achilles tendon. Peripheral neuropathy.  Treatment: Both feet casted for Orthotics. Medication change from Ultram to Mobic 7.5 mg bid prn.

## 2017-11-08 IMAGING — CT CT MAXILLOFACIAL W/ CM
3 series · 15 of 47 positions shown, 18 images · IV contrast (iopamidol)
Comparison: None

CLINICAL DATA: Right cheek swelling and redness.

EXAM:
CT MAXILLOFACIAL WITH CONTRAST
TECHNIQUE: Multidetector CT imaging of the maxillofacial structures was
performed with intravenous contrast. Multiplanar CT image
reconstructions were also generated. A small metallic BB was placed
on the right temple in order to reliably differentiate right from
left.
CONTRAST:  100mL XLLBSV-811 IOPAMIDOL (XLLBSV-811) INJECTION 61%

[Series 3: facial st · axial · 0.36mm/px · z∈[-208,-76]mm · 9 of 78 slices shown, 12 images]
[im 6/78  brain]
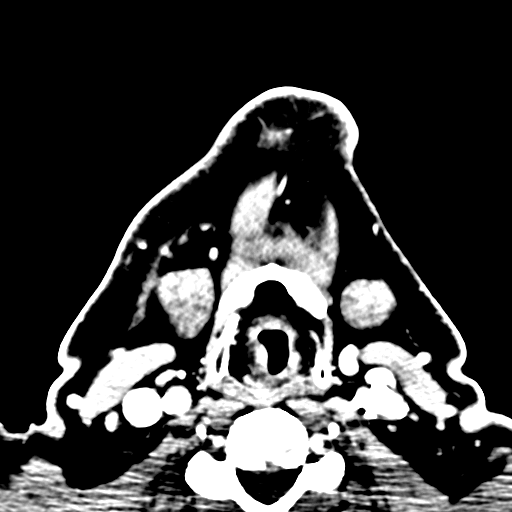
[im 6/78  bone]
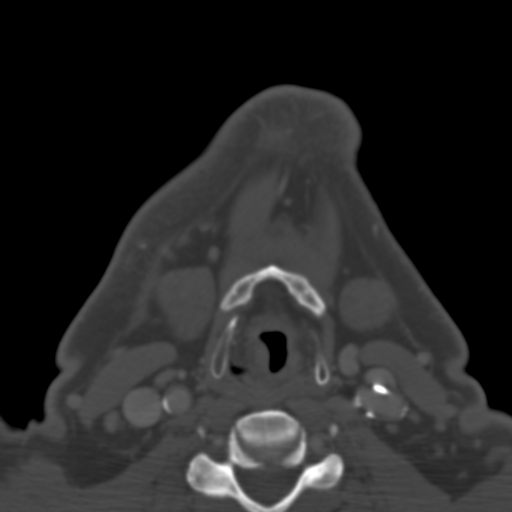
[im 14/78  bone]
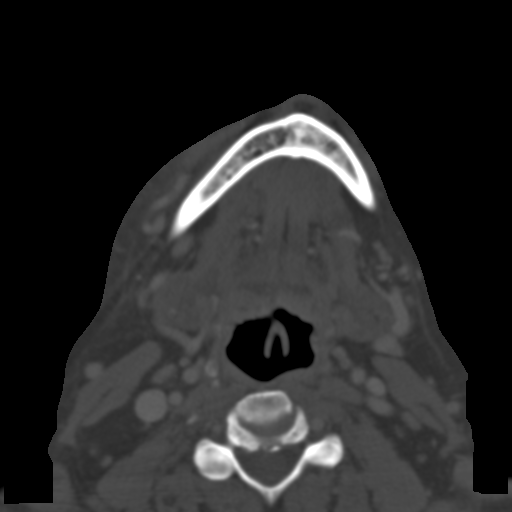
[im 22/78  bone]
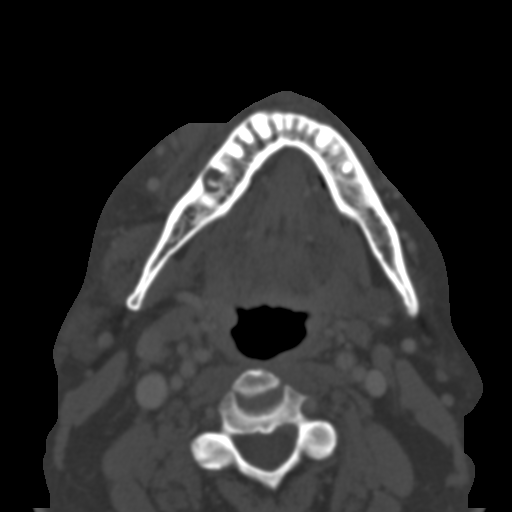
[im 30/78  bone]
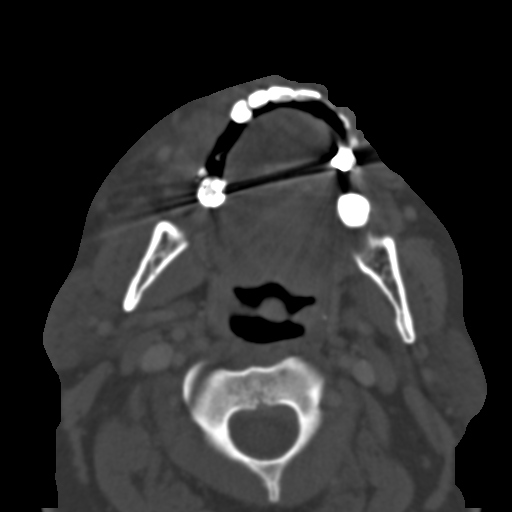
[im 40/78  brain]
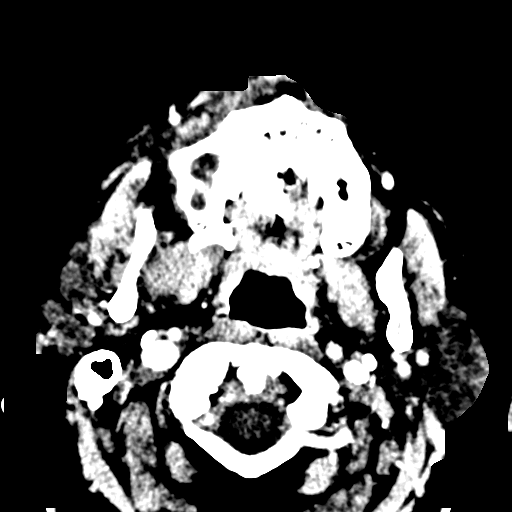
[im 40/78  bone]
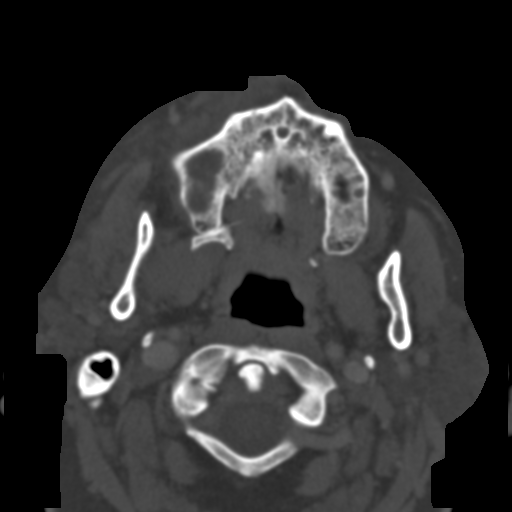
[im 48/78  bone]
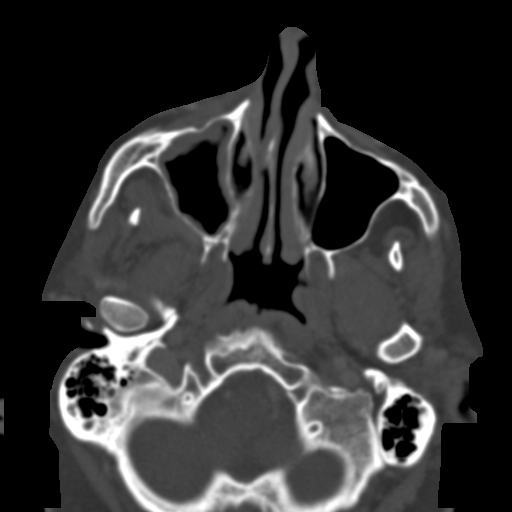
[im 56/78  bone]
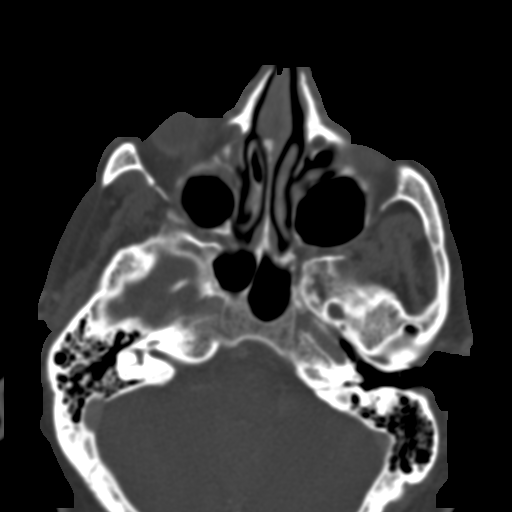
[im 64/78  bone]
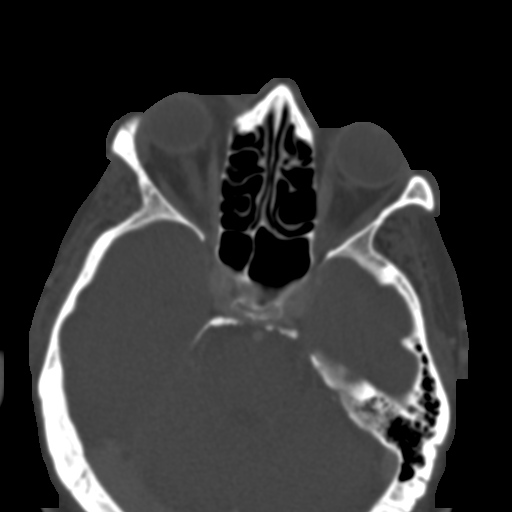
[im 72/78  brain]
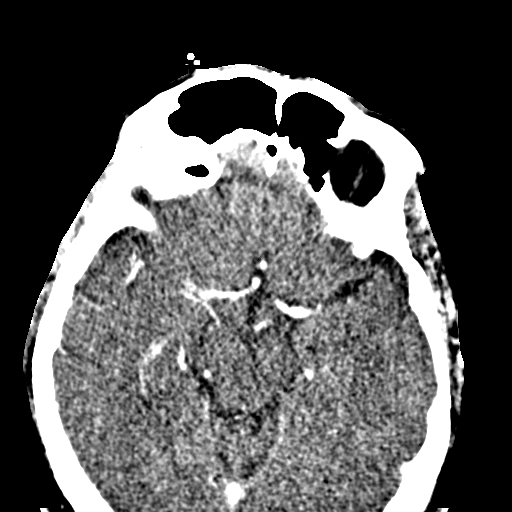
[im 72/78  bone]
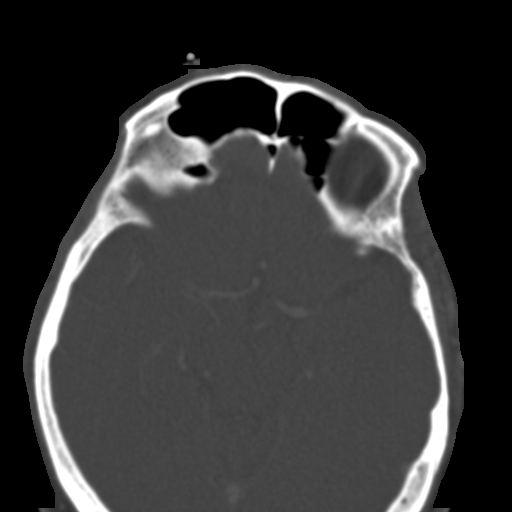

[Series 7: coronal st · coronal · 0.34mm/px · 3 of 85 slices shown]
[im 29/85  bone]
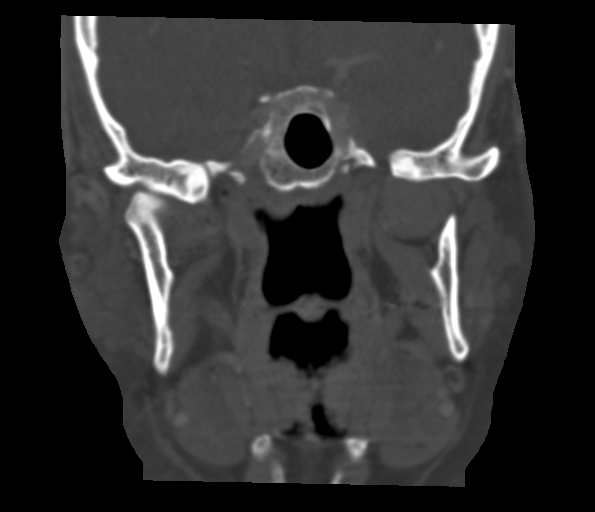
[im 38/85  bone]
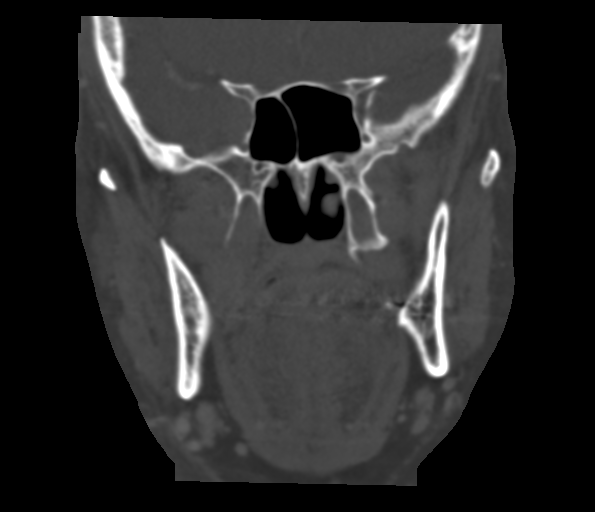
[im 47/85  bone]
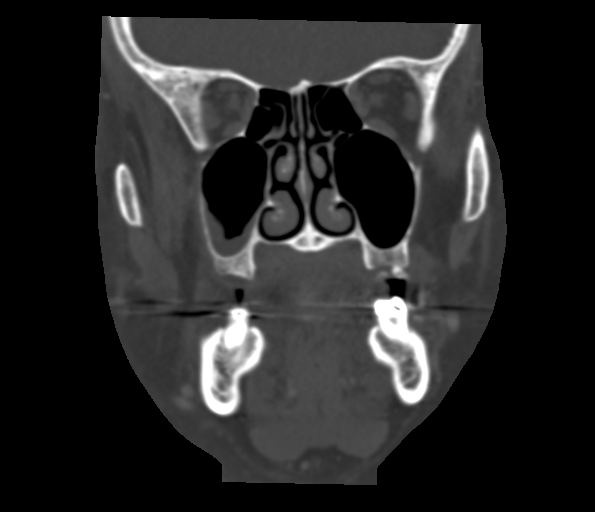

[Series 8: sagittal st · sagittal · 0.33mm/px · 3 of 96 slices shown]
[im 32/96  bone]
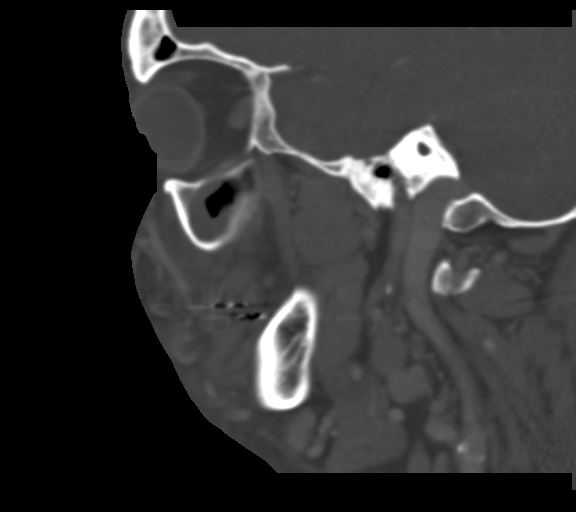
[im 48/96  bone]
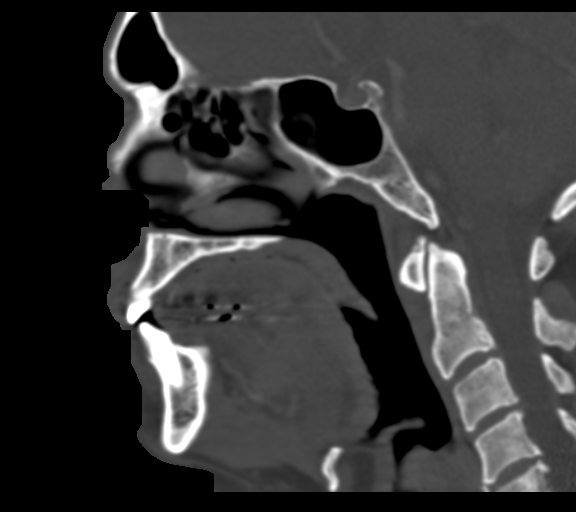
[im 64/96  bone]
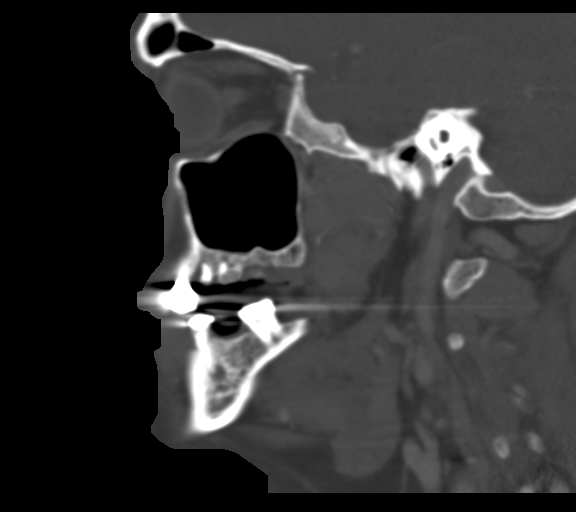

[15 of 47 positions shown; findings below may reference images not displayed]

FINDINGS: Very poor dentition with multiple upper and lower periapical
lucencies. There is a periapical lucency involving the right upper
second molar. There is adjacent mucosal thickening involving the
right maxillary sinus. There is a small periosteal fluid collection
overlying the anterior lateral aspect of the right side of maxilla
which measures 7 by 18 mm, image 39 of series 13. Overlying
subcutaneous fat stranding and skin thickening is identified.
Prominent right submandibular lymph node 9 mm in short axis. Right
level-II node measures 1 cm, image 19 of series 3.

The visualized intracranial contents are unremarkable.
IMPRESSION: 1. Overall poor dentition with multiple periapical lucencies.
2. Right upper second molar periapical abscess with associated right
maxillary sinus inflammation. Additionally, there is a small
periosteal fluid collection/abscess associated with the right
maxilla and overlying cellulitis.
3. Borderline enlarged right level 1 and 2 nodes are likely
reactive.

## 2017-11-18 ENCOUNTER — Encounter: Payer: Self-pay | Admitting: Diagnostic Neuroimaging

## 2017-11-19 ENCOUNTER — Encounter: Payer: Self-pay | Admitting: Diagnostic Neuroimaging

## 2017-11-19 ENCOUNTER — Ambulatory Visit: Payer: BLUE CROSS/BLUE SHIELD | Admitting: Diagnostic Neuroimaging

## 2017-11-19 VITALS — BP 130/87 | HR 86 | Ht 73.5 in | Wt 256.8 lb

## 2017-11-19 DIAGNOSIS — M79672 Pain in left foot: Secondary | ICD-10-CM | POA: Diagnosis not present

## 2017-11-19 DIAGNOSIS — E1142 Type 2 diabetes mellitus with diabetic polyneuropathy: Secondary | ICD-10-CM | POA: Diagnosis not present

## 2017-11-19 DIAGNOSIS — M79671 Pain in right foot: Secondary | ICD-10-CM

## 2017-11-19 NOTE — Patient Instructions (Signed)
-   gradually increase physical activity  - consider gabapentin, amitriptyline, duloxetine or topical neuropathy cream for pain  - consider MRI lumbar spine if foot / back pain significantly increases or becomes constant  - follow up with Dr. Mitchel Honour (PCP) re: arterial ultrasound study of legs (ordered but not done yet)

## 2017-11-19 NOTE — Progress Notes (Signed)
[facility]  PATIENT: Steve Keller DOB: 24-Jan-1956  REFERRING CLINICIAN: Leane Platt, MD HISTORY FROM: patient and chart review  REASON FOR VISIT: new consult    HISTORICAL  CHIEF COMPLAINT:  Chief Complaint  Patient presents with  . New Patient (Initial Visit)    Patient c/o having occasional pain in the joints of his toes    HISTORY OF PRESENT ILLNESS:   62 year old male here for evaluation of bilateral foot pain.  Patient has history of diabetes with hemoglobin A1c greater than 12 in 2017.  Fortunately patient has been able to greatly improve his diabetic control and last hemoglobin A1c was 6.4 a few months ago.  Patient did have bilateral burning sensation in feet 1-2 years ago which has improved over time as his diabetic control has improved.  Previously he was on gabapentin, amitriptyline and tramadol, but patient stopped these due to side effects.  About 1 year ago patient had a different type of bilateral foot pain, left worse than right, with a tight throbbing and sharp shooting sensation in the arches of his feet, toes, joints in the feet, ankles, 3-4 times per day lasting 15-20 minutes at a time.  No specific triggering or aggravating factors.  Symptoms could occur while sitting down or with exertion.  He noted that certain types of repetitive pounding sensation on the feet such as walking on a treadmill seem to aggravate the symptoms.  Patient went to PCP who referred patient here to neurology and ordered bilateral lower extremity arterial ultrasound (ordered but not done yet).  Patient also has been to podiatry and had custom orthotic inserts made, which patient is planning to start using soon.    REVIEW OF SYSTEMS: Full 14 system review of systems performed and negative with exception of: Decreased energy joint pain fatigue.  ALLERGIES: No Known Allergies  HOME MEDICATIONS: Outpatient Medications Prior to Visit  Medication Sig Dispense Refill   . aspirin EC 81 MG tablet Take 81 mg by mouth daily.    Marland Kitchen atorvastatin (LIPITOR) 20 MG tablet Take 1 tablet (20 mg total) by mouth daily. 90 tablet 3  . blood glucose meter kit and supplies Dispense based on patient and insurance preference. Use up to four times daily as directed. (FOR ICD-9 250.00, 250.01). 1 each 0  . Insulin Degludec 200 UNIT/ML SOPN Inject into the skin. inject 24 units each evening and each 2nd day if > 210, increase dose by one uniit    . lisinopril (PRINIVIL,ZESTRIL) 10 MG tablet Take 1 tablet (10 mg total) by mouth daily. 90 tablet 1  . metFORMIN (GLUCOPHAGE) 1000 MG tablet Take 1 tablet (1,000 mg total) by mouth 2 (two) times daily with a meal. 180 tablet 1  . naproxen (NAPROSYN) 500 MG tablet Take 1 tablet (500 mg total) by mouth 2 (two) times daily with a meal. 60 tablet 2  . diclofenac (VOLTAREN) 75 MG EC tablet Take 1 tablet (75 mg total) by mouth 2 (two) times daily. 30 tablet 0  . meloxicam (MOBIC) 7.5 MG tablet Take 1 tablet (7.5 mg total) by mouth 2 (two) times daily. 60 tablet 0  . traMADol (ULTRAM) 50 MG tablet Take 1 tablet (50 mg total) by mouth every 8 (eight) hours as needed. 30 tablet 0   Facility-Administered Medications Prior to Visit  Medication Dose Route Frequency Provider Last Rate Last Dose  . 0.9 %  sodium chloride infusion  500 mL Intravenous Continuous Nandigam, Venia Minks, MD  PAST MEDICAL HISTORY: Past Medical History:  Diagnosis Date  . Diabetes mellitus without complication ([place])    diagnosed 06/2016   . Hyperlipidemia   . Hypertension     PAST SURGICAL HISTORY: Past Surgical History:  Procedure Laterality Date  . APPENDECTOMY  1983  . MULTIPLE EXTRACTIONS WITH ALVEOLOPLASTY N/A 06/19/2016   Procedure: EXTRACTION OF TOOTH #'S 2,4,5,13,14,15,20,21,AND 30 WITH ALVEOLOPLASTY AND GROSS DEBRIDEMENT OF REMAINING TEETH;  Surgeon: [name], DDS;  Location: [facility];  Service: Oral Surgery;  Laterality: N/A;  Throat pack in:  0853 Throat pack out: 1015    FAMILY HISTORY: Family History  Problem Relation Age of Onset  . CVA Father   . Diabetes Mellitus II Father   . Cancer Father   . Heart disease Mother   . Hypertension Mother   . Other Sister        immune disease  . Hypertension Sister   . Lupus Sister   . Heart disease Brother   . Hypertension Brother   . Diabetes Maternal Grandmother   . Hypertension Maternal Grandmother   . Diabetes Maternal Grandfather   . Hypertension Maternal Grandfather   . Colon cancer Neg Hx     SOCIAL HISTORY:  Social History   Socioeconomic History  . Marital status: Single    Spouse name: Not on file  . Number of children: Not on file  . Years of education: Not on file  . Highest education level: Not on file  Social Needs  . Financial resource strain: Not on file  . Food insecurity - worry: Not on file  . Food insecurity - inability: Not on file  . Transportation needs - medical: Not on file  . Transportation needs - non-medical: Not on file  Occupational History  . Not on file  Tobacco Use  . Smoking status: Current Every Day Smoker    Packs/day: 0.15    Years: 44.00    Pack years: 6.60    Types: Cigarettes  . Smokeless tobacco: Never Used  Substance and Sexual Activity  . Alcohol use: No  . Drug use: No  . Sexual activity: Not on file  Other Topics Concern  . Not on file  Social History Narrative  . Not on file     PHYSICAL EXAM  GENERAL EXAM/CONSTITUTIONAL: Vitals:  Vitals:   11/19/17 0827  BP: 130/87  Pulse: 86  Weight: 256 lb 12.8 oz (116.5 kg)  Height: 6' 1.5" (1.867 m)     Body mass index is 33.42 kg/m.  Visual Acuity Screening   Right eye Left eye Both eyes  Without correction: 20/40 20/40   With correction:        Patient is in no distress; well developed, nourished and groomed; neck is supple  CARDIOVASCULAR:  Examination of carotid arteries is normal; no carotid bruits  Regular rate and rhythm, no  murmurs  Examination of peripheral vascular system by observation and palpation is normal; EXCEPT DECREASED DORSALIS PEDIS PULSES  EYES:  Ophthalmoscopic exam of optic discs and posterior segments is normal; no papilledema or hemorrhages  MUSCULOSKELETAL:  Gait, strength, tone, movements noted in Neurologic exam below  NEUROLOGIC: MENTAL STATUS:  No flowsheet data found.  awake, alert, oriented to person, place and time  recent and remote memory intact  normal attention and concentration  language fluent, comprehension intact, naming intact,   fund of knowledge appropriate  CRANIAL NERVE:   2nd - no papilledema on fundoscopic exam  2nd, 3rd, 4th, 6th -  pupils equal and reactive to light, visual fields full to confrontation, extraocular muscles intact, no nystagmus  5th - facial sensation symmetric  7th - facial strength symmetric  8th - hearing intact  9th - palate elevates symmetrically, uvula midline  11th - shoulder shrug symmetric  12th - tongue protrusion midline  MOTOR:   normal bulk and tone, full strength in the BUE, BLE  SENSORY:   normal and symmetric to light touch, pinprick, temperature, vibration; EXCEPT DECR PP IN RIGHT FOOT DORSUM; DECR VIB IN FEET; DECR TEMP IN FEET  STRAIGHT LEG RAISE POSITIVE IN RIGHT, WITH PAIN ANTERIORLY ON RIGHT THIGH  COORDINATION:   finger-nose-finger, fine finger movements --> MILD INTENTION TREMOR  REFLEXES:   deep tendon reflexes TRACE and symmetric; ABSENT AT ANKLES  GAIT/STATION:   narrow based gait; able to walk on toes, heels and tandem; romberg is negative    DIAGNOSTIC DATA (LABS, IMAGING, TESTING) - I reviewed patient records, labs, notes, testing and imaging myself where available.  Lab Results  Component Value Date   WBC 10.5 08/01/2017   HGB 17.0 08/01/2017   HCT 50.7 08/01/2017   MCV 87 08/01/2017   PLT 202 08/01/2017      Component Value Date/Time   NA 141 08/01/2017 0855   K 4.7  08/01/2017 0855   CL 101 08/01/2017 0855   CO2 24 08/01/2017 0855   GLUCOSE 139 (H) 08/01/2017 0855   GLUCOSE 138 (H) 09/04/2016 0813   BUN 10 08/01/2017 0855   CREATININE 1.02 08/01/2017 0855   CREATININE 0.90 09/04/2016 0813   CALCIUM 9.8 08/01/2017 0855   PROT 7.2 08/01/2017 0855   ALBUMIN 4.5 08/01/2017 0855   AST 17 08/01/2017 0855   ALT 34 08/01/2017 0855   ALKPHOS 64 08/01/2017 0855   BILITOT 0.3 08/01/2017 0855   GFRNONAA 78 08/01/2017 0855   GFRNONAA >89 09/04/2016 0813   GFRAA 91 08/01/2017 0855   GFRAA >89 09/04/2016 0813   Lab Results  Component Value Date   CHOL 165 08/01/2017   HDL 37 (L) 08/01/2017   LDLCALC 99 08/01/2017   TRIG 146 08/01/2017   CHOLHDL 4.5 08/01/2017    Hemoglobin A1C  Date Value Ref Range Status  08/01/2017 6.4  Final  12/18/2016 6.9  Final  09/04/2016 7.2  Final   Hgb A1c MFr Bld  Date Value Ref Range Status  06/17/2016 12.8 (H) 4.8 - 5.6 % Final    Comment:    (NOTE)         Pre-diabetes: 5.7 - 6.4         Diabetes: >6.4         Glycemic control for adults with diabetes: <7.0     Lab Results  Component Value Date   VITAMINB12 295 02/12/2017   Lab Results  Component Value Date   TSH 1.250 04/04/2017    03/06/10 CT head [I reviewed images myself and agree with interpretation. -VRP]  - negative     ASSESSMENT AND PLAN  62 y.o. year old male here with 1-2 years of bilateral burning sensation in feet, also with intermittent sharp shooting pains in the feet for past 1 year, 15-20 minutes at a time, 3-4 times per day.  Signs symptoms consistent with diabetic peripheral neuropathy.  Patient may also have lumbar spinal stenosis or lumbar radiculopathy.  Patient also may have "compensated rearfoot varus bilateral, plantar fasciitis bilateral, cavovarus foot bilateral, contracture tendon right Achilles tendon" according to podiatry clinic.   Dx: diabetic neuropathy +  foot/ankle issues +/- lumbar radiculopathy  1. Diabetic  polyneuropathy associated with type 2 diabetes mellitus ([place])   2. Foot pain, bilateral     PLAN: - gradually increase physical activity; recommend lower impact (water therapy, yoga, cycling) - consider gabapentin, amitriptyline, duloxetine or topical neuropathy cream for pain / neuropathy (patient wants to hold off for now) - consider MRI lumbar spine if foot / back pain significantly increases or becomes constant - follow up with PCP re: ABI / arterial ultrasound study of legs (ordered but not done yet)  Return if symptoms worsen or fail to improve, for return to PCP.    Penni Bombard, MD 06/19/6858, 9:23 AM Certified in Neurology, Neurophysiology and Neuroimaging  [facility] [address] [place], [place] [address] [phone]

## 2017-11-28 ENCOUNTER — Telehealth: Payer: Self-pay | Admitting: *Deleted

## 2017-11-28 NOTE — Telephone Encounter (Signed)
Patient picked up foot orthotics

## 2017-11-28 NOTE — Telephone Encounter (Signed)
Patient picked up foot orthotics on 11/26/17.

## 2018-01-10 ENCOUNTER — Other Ambulatory Visit: Payer: Self-pay | Admitting: Physician Assistant

## 2018-01-10 DIAGNOSIS — E1140 Type 2 diabetes mellitus with diabetic neuropathy, unspecified: Secondary | ICD-10-CM

## 2018-01-12 NOTE — Telephone Encounter (Signed)
Will route to office for review; not on pt medication list.

## 2018-01-15 NOTE — Telephone Encounter (Signed)
Please advise/refill for amitriptyline (ELAVIL)  LOV 08/01/2017 for type 2 diabetes

## 2018-02-08 ENCOUNTER — Encounter: Payer: Self-pay | Admitting: Gastroenterology

## 2018-08-16 ENCOUNTER — Other Ambulatory Visit: Payer: Self-pay

## 2018-08-16 ENCOUNTER — Emergency Department (HOSPITAL_COMMUNITY): Payer: Self-pay

## 2018-08-16 ENCOUNTER — Encounter (HOSPITAL_COMMUNITY): Payer: Self-pay

## 2018-08-16 ENCOUNTER — Emergency Department (HOSPITAL_COMMUNITY)
Admission: EM | Admit: 2018-08-16 | Discharge: 2018-08-16 | Disposition: A | Discharge status: Home / Self Care | Payer: Self-pay | Attending: Emergency Medicine | Admitting: Emergency Medicine

## 2018-08-16 DIAGNOSIS — E119 Type 2 diabetes mellitus without complications: Secondary | ICD-10-CM | POA: Insufficient documentation

## 2018-08-16 DIAGNOSIS — H532 Diplopia: Secondary | ICD-10-CM | POA: Insufficient documentation

## 2018-08-16 DIAGNOSIS — F17210 Nicotine dependence, cigarettes, uncomplicated: Secondary | ICD-10-CM | POA: Insufficient documentation

## 2018-08-16 DIAGNOSIS — R51 Headache: Secondary | ICD-10-CM | POA: Insufficient documentation

## 2018-08-16 DIAGNOSIS — I10 Essential (primary) hypertension: Secondary | ICD-10-CM | POA: Insufficient documentation

## 2018-08-16 DIAGNOSIS — Z7982 Long term (current) use of aspirin: Secondary | ICD-10-CM | POA: Insufficient documentation

## 2018-08-16 LAB — URINALYSIS, ROUTINE W REFLEX MICROSCOPIC
BILIRUBIN URINE: NEGATIVE
Glucose, UA: NEGATIVE mg/dL
Hgb urine dipstick: NEGATIVE
KETONES UR: NEGATIVE mg/dL
LEUKOCYTES UA: NEGATIVE
NITRITE: NEGATIVE
PROTEIN: NEGATIVE mg/dL
Specific Gravity, Urine: 1.014 (ref 1.005–1.030)
pH: 5 (ref 5.0–8.0)

## 2018-08-16 LAB — C-REACTIVE PROTEIN: CRP: 1.2 mg/dL — ABNORMAL HIGH (ref <1.0)

## 2018-08-16 LAB — PROTIME-INR
INR: 0.92
PROTHROMBIN TIME: 12.3 s (ref 11.4–15.2)

## 2018-08-16 LAB — COMPREHENSIVE METABOLIC PANEL
ALK PHOS: 67 U/L (ref 38–126)
ALT: 57 U/L — AB (ref 0–44)
AST: 42 U/L — ABNORMAL HIGH (ref 15–41)
Albumin: 4.9 g/dL (ref 3.5–5.0)
Anion gap: 10 (ref 5–15)
BILIRUBIN TOTAL: 0.9 mg/dL (ref 0.3–1.2)
BUN: 11 mg/dL (ref 8–23)
CALCIUM: 9.8 mg/dL (ref 8.9–10.3)
CO2: 27 mmol/L (ref 22–32)
CREATININE: 1.11 mg/dL (ref 0.61–1.24)
Chloride: 103 mmol/L (ref 98–111)
GFR calc non Af Amer: >60 mL/min (ref >60)
GLUCOSE: 202 mg/dL — AB (ref 70–99)
Potassium: 3.5 mmol/L (ref 3.5–5.1)
Sodium: 140 mmol/L (ref 135–145)
TOTAL PROTEIN: 8 g/dL (ref 6.5–8.1)

## 2018-08-16 LAB — CBC
HCT: 53.9 % — ABNORMAL HIGH (ref 39.0–52.0)
HEMOGLOBIN: 17.9 g/dL — AB (ref 13.0–17.0)
MCH: 28.6 pg (ref 26.0–34.0)
MCHC: 33.2 g/dL (ref 30.0–36.0)
MCV: 86.1 fL (ref 80.0–100.0)
Platelets: 208 10*3/uL (ref 150–400)
RBC: 6.26 MIL/uL — ABNORMAL HIGH (ref 4.22–5.81)
RDW: 13.2 % (ref 11.5–15.5)
WBC: 10.9 10*3/uL — AB (ref 4.0–10.5)
nRBC: 0 % (ref 0.0–0.2)

## 2018-08-16 LAB — RAPID URINE DRUG SCREEN, HOSP PERFORMED
AMPHETAMINES: NOT DETECTED
Barbiturates: NOT DETECTED
Benzodiazepines: NOT DETECTED
Cocaine: NOT DETECTED
Opiates: NOT DETECTED
TETRAHYDROCANNABINOL: NOT DETECTED

## 2018-08-16 LAB — DIFFERENTIAL
Abs Immature Granulocytes: 0.05 10*3/uL (ref 0.00–0.07)
BASOS ABS: 0.1 10*3/uL (ref 0.0–0.1)
Basophils Relative: 1 %
EOS PCT: 6 %
Eosinophils Absolute: 0.6 10*3/uL — ABNORMAL HIGH (ref 0.0–0.5)
Immature Granulocytes: 1 %
LYMPHS ABS: 2.2 10*3/uL (ref 0.7–4.0)
LYMPHS PCT: 20 %
MONO ABS: 0.8 10*3/uL (ref 0.1–1.0)
Monocytes Relative: 7 %
NEUTROS ABS: 7.1 10*3/uL (ref 1.7–7.7)
Neutrophils Relative %: 65 %

## 2018-08-16 LAB — SEDIMENTATION RATE: SED RATE: 1 mm/h (ref 0–16)

## 2018-08-16 LAB — I-STAT TROPONIN, ED: Troponin i, poc: 0.01 ng/mL (ref 0.00–0.08)

## 2018-08-16 LAB — APTT: aPTT: 30 seconds (ref 24–36)

## 2018-08-16 LAB — I-STAT CHEM 8, ED
BUN: 8 mg/dL (ref 8–23)
CALCIUM ION: 1.17 mmol/L (ref 1.15–1.40)
Chloride: 102 mmol/L (ref 98–111)
Creatinine, Ser: 1.1 mg/dL (ref 0.61–1.24)
GLUCOSE: 213 mg/dL — AB (ref 70–99)
HCT: 52 % (ref 39.0–52.0)
HEMOGLOBIN: 17.7 g/dL — AB (ref 13.0–17.0)
Potassium: 3.8 mmol/L (ref 3.5–5.1)
SODIUM: 141 mmol/L (ref 135–145)
TCO2: 28 mmol/L (ref 22–32)

## 2018-08-16 LAB — ETHANOL

## 2018-08-16 LAB — TSH: TSH: 0.917 u[IU]/mL (ref 0.350–4.500)

## 2018-08-16 MED ORDER — SODIUM CHLORIDE 0.9 % IV SOLN
100.0000 mL/h | INTRAVENOUS | Status: DC
  Administered 2018-08-16: 100 mL/h via INTRAVENOUS

## 2018-08-16 MED ORDER — SODIUM CHLORIDE 0.9 % IV BOLUS
500.0000 mL | Freq: Once | INTRAVENOUS | Status: AC
  Administered 2018-08-16: 500 mL via INTRAVENOUS

## 2018-08-16 NOTE — ED Notes (Signed)
Bed: [facility] Expected date:  Expected time:  Means of arrival:  Comments: 

## 2018-08-16 NOTE — ED Notes (Signed)
Patient transported to CT 

## 2018-08-16 NOTE — ED Notes (Signed)
ED Provider at bedside. 

## 2018-08-16 NOTE — ED Notes (Signed)
Patient made aware urine sample is needed. 

## 2018-08-16 NOTE — ED Triage Notes (Signed)
Pt states that he developed a headache today. Pt states if he closes his right eye, he can see fine out of his left. Pt states this started today. Pt describes "strobe effect" as well. Pt describes pain in temples.  Pt states he left the house at 10am, and was fine.  Pt states the problem resolves when looking all the way up or down.

## 2018-08-16 NOTE — ED Notes (Signed)
Neurologist on [place].

## 2018-08-16 NOTE — ED Notes (Signed)
Patient transported to MRI 

## 2018-08-16 NOTE — Discharge Instructions (Addendum)
Call Dr. Benna Dunks office tomorrow at 0830 to set up a time to be seen tomorrow.

## 2018-08-16 NOTE — ED Notes (Signed)
Teleneuro activated at bedside.

## 2018-08-16 NOTE — Consult Note (Signed)
[facility] Consult Services   Date of Service:   08/16/2018 11:58:24  Impression:     .  RO Acute Ischemic Stroke     .  acute right eye blurred vision, right temporal headache, concern for possible temporal arteritis  Comments: CT brain shows no acute intracranial abnormality.  Metrics: Last Known Well: 08/16/2018 10:00:10 [facility] Notification Time: 08/16/2018 11:58:24 Arrival Time: 08/16/2018 12:02:29 Stamp Time: 08/16/2018 11:58:24 Time First Login Attempt: 08/16/2018 12:02:00 Video Start Time: 08/16/2018 12:02:00  Symptoms: acute right eye blurred vision bitemporal headache NIHSS Start Assessment Time: 08/16/2018 12:04:22 Patient is not a candidate for tPA. Patient was not deemed candidate for tPA thrombolytics because of NIHSS 0. Video End Time: 08/16/2018 12:16:42  CT head showed no acute hemorrhage or acute core infarct. CT head was reviewed.  Advanced imaging was not obtained as the presentation was not suggestive of Large Vessel Occlusive Disease.   ER Physician notified of the decision on thrombolytics management on 08/16/2018 12:08:05  Our recommendations are outlined below.  Recommendations:     .  Activate Stroke Protocol Admission/Order Set     .  Stroke/Telemetry Floor     .  Neuro Checks     .  Bedside Swallow Eval     .  DVT Prophylaxis     .  IV Fluids, Normal Saline     .  Head of Bed Below 30 Degrees     .  Euglycemia and Avoid Hyperthermia (PRN Acetaminophen)     .  Antiplatelet Therapy Recommended     .  Check ESR and C-reactive protein. Ophthalmology consultation  Routine Consultation with [facility] for Follow up Care  Sign Out:     .  Discussed with Emergency Department Provider    ------------------------------------------------------------------------------  History of Present Illness: Patient is a 62 year old Male.  Patient was brought by EMS for symptoms of acute right eye  blurred vision bitemporal headache  62 year old male presents with acute right eye blurred vision and a bitemporal headache. PMH: DM, HTN, HLD. Not on anticoagulation.  CT head showed no acute hemorrhage or acute core infarct. CT head was reviewed.  Last seen normal was within 4.5 hours. There is no history of hemorrhagic complications or intracranial hemorrhage. There is no history of Recent Anticoagulants. There is no history of recent major surgery. There is no history of recent stroke.  Examination: 1A: Level of Consciousness - Alert; keenly responsive + 0 1B: Ask Month and Age - Both Questions Right + 0 1C: Blink Eyes & Squeeze Hands - Performs Both Tasks + 0 2: Test Horizontal Extraocular Movements - Normal + 0 3: Test Visual Fields - No Visual Loss + 0 4: Test Facial Palsy (Use Grimace if Obtunded) - Normal symmetry + 0 5A: Test Left Arm Motor Drift - No Drift for 10 Seconds + 0 5B: Test Right Arm Motor Drift - No Drift for 10 Seconds + 0 6A: Test Left Leg Motor Drift - No Drift for 5 Seconds + 0 6B: Test Right Leg Motor Drift - No Drift for 5 Seconds + 0 7: Test Limb Ataxia (FNF/Heel-Shin) - No Ataxia + 0 8: Test Sensation - Normal; No sensory loss + 0 9: Test Language/Aphasia - Normal; No aphasia + 0 10: Test Dysarthria - Normal + 0 11: Test Extinction/Inattention - No abnormality + 0  NIHSS Score: 0  Patient was informed the Neurology Consult would happen via TeleHealth consult by way of interactive  audio and video telecommunications and consented to receiving care in this manner.  Due to the immediate potential for life-threatening deterioration due to underlying acute neurologic illness, I spent 35 minutes providing critical care. This time includes time for face to face visit via telemedicine, review of medical records, imaging studies and discussion of findings with providers, the patient and/or family.   Dr Ellin Goodie   [facility] [phone]

## 2018-08-16 NOTE — ED Provider Notes (Signed)
[facility] DEPT Provider Note   CSN: [id number] Arrival date & time: 08/16/18  1021     History   Chief Complaint Chief Complaint  Patient presents with  . Headache  . Visual Field Change    HPI Steve Keller is a 62 y.o. male.  HPI Patient presents to the emergency room for evaluation of acute visual disturbance.  Patient states he had a very mild headache yesterday but did not think too much of it.  He was able to work without any difficulty.  When he got up this morning he was fine.  He started driving away from his house and he noticed that he suddenly had difficulty with double vision.  This occurred at 10 AM.  Patient noticed that it is specifically worse when he tries to look to the right.  He sees 2 objects and is having difficulty with his coordination.  The symptoms do get better when he covers his eye.  Denies any trouble with speech disturbance.  He denies any focal numbness or weakness.  Patient does have a history of diabetes and has not been on any medications for several months.  He does smoke Past Medical History:  Diagnosis Date  . Diabetes mellitus without complication ([place])    diagnosed 06/2016   . Hyperlipidemia   . Hypertension     Patient Active Problem List   Diagnosis Date Noted  . Diabetic polyneuropathy associated with type 2 diabetes mellitus ([place]) 11/19/2017  . Hyperesthesia 09/19/2017  . Foot pain, bilateral 09/19/2017  . Type 2 diabetes mellitus ([place]) 02/12/2017  . Chronic periodontitis 06/19/2016  . Polycythemia 06/16/2016  . Tobacco use disorder 06/16/2016    Past Surgical History:  Procedure Laterality Date  . APPENDECTOMY  1983  . MULTIPLE EXTRACTIONS WITH ALVEOLOPLASTY N/A 06/19/2016   Procedure: EXTRACTION OF TOOTH #'S 2,4,5,13,14,15,20,21,AND 30 WITH ALVEOLOPLASTY AND GROSS DEBRIDEMENT OF REMAINING TEETH;  Surgeon: [name], DDS;  Location: [facility];  Service: Oral Surgery;  Laterality: N/A;   Throat pack in: 0853 Throat pack out: 1015        Home Medications    Prior to Admission medications   Medication Sig Start Date End Date Taking? Authorizing Provider  aspirin EC 81 MG tablet Take 81 mg by mouth daily.   Yes [provider]  ibuprofen (ADVIL,MOTRIN) 200 MG tablet Take 400 mg by mouth every 6 (six) hours as needed for mild pain.   Yes [provider]  naproxen sodium (ALEVE) 220 MG tablet Take 220 mg by mouth daily as needed (headache).   Yes [provider]  amitriptyline (ELAVIL) 25 MG tablet TAKE 1 TABLET BY MOUTH ONCE DAILY AT BEDTIME Patient not taking: Reported on 08/16/2018 01/15/18   Tenna Delaine D, PA-C  atorvastatin (LIPITOR) 20 MG tablet Take 1 tablet (20 mg total) by mouth daily. Patient not taking: Reported on 08/16/2018 08/01/17   Tenna Delaine D, PA-C  blood glucose meter kit and supplies Dispense based on patient and insurance preference. Use up to four times daily as directed. (FOR ICD-9 250.00, 250.01). Patient not taking: Reported on 08/16/2018 06/18/16   Geradine Girt, DO  lisinopril (PRINIVIL,ZESTRIL) 10 MG tablet Take 1 tablet (10 mg total) by mouth daily. Patient not taking: Reported on 08/16/2018 08/01/17   Leonie Douglas, PA-C  metFORMIN (GLUCOPHAGE) 1000 MG tablet Take 1 tablet (1,000 mg total) by mouth 2 (two) times daily with a meal. Patient not taking: Reported on 08/16/2018 08/01/17  Tenna Delaine D, PA-C  naproxen (NAPROSYN) 500 MG tablet Take 1 tablet (500 mg total) by mouth 2 (two) times daily with a meal. Patient not taking: Reported on 08/16/2018 10/01/17   Camelia Phenes, DPM    Family History Family History  Problem Relation Age of Onset  . CVA Father   . Diabetes Mellitus II Father   . Cancer Father   . Heart disease Mother   . Hypertension Mother   . Other Sister        immune disease  . Hypertension Sister   . Lupus Sister   . Heart disease Brother   . Hypertension Brother    . Diabetes Maternal Grandmother   . Hypertension Maternal Grandmother   . Diabetes Maternal Grandfather   . Hypertension Maternal Grandfather   . Colon cancer Neg Hx     Social History Social History   Tobacco Use  . Smoking status: Current Every Day Smoker    Packs/day: 0.15    Years: 44.00    Pack years: 6.60    Types: Cigarettes  . Smokeless tobacco: Never Used  Substance Use Topics  . Alcohol use: No  . Drug use: No     Allergies   Patient has no known allergies.   Review of Systems Review of Systems  All other systems reviewed and are negative.    Physical Exam Updated Vital Signs BP (!) 143/87 (BP Location: Right Arm)   Pulse 71   Temp 98.1 F (36.7 C) (Oral)   Resp 20   Ht 1.88 m ('6\' 2"' )   Wt 114.3 kg   SpO2 95%   BMI 32.35 kg/m   Physical Exam  Constitutional: He is oriented to person, place, and time. He appears well-developed and well-nourished. No distress.  HENT:  Head: Normocephalic and atraumatic.  Right Ear: External ear normal.  Left Ear: External ear normal.  Mouth/Throat: Oropharynx is clear and moist.  Eyes: Conjunctivae are normal. Right eye exhibits no discharge. Left eye exhibits no discharge. No scleral icterus. Right eye exhibits no nystagmus. Left eye exhibits no nystagmus.  ?limitation of lateral gaze right eye  Neck: Neck supple. No tracheal deviation present.  Cardiovascular: Normal rate, regular rhythm and intact distal pulses.  Pulmonary/Chest: Effort normal and breath sounds normal. No stridor. No respiratory distress. He has no wheezes. He has no rales.  Abdominal: Soft. Bowel sounds are normal. He exhibits no distension. There is no tenderness. There is no rebound and no guarding.  Musculoskeletal: He exhibits no edema or tenderness.  Neurological: He is alert and oriented to person, place, and time. He has normal strength. No cranial nerve deficit (No facial droop, ?limited lateral gaze right eye, tongue midline  ) or  sensory deficit. He exhibits normal muscle tone. He displays no seizure activity. Coordination normal.  No pronator drift bilateral upper extrem, able to hold both legs off bed for 5 seconds, sensation intact in all extremities, no visual field cuts, no left or right sided neglect, abnormal finger-nose exam when trying to point to the right, no nystagmus noted   Skin: Skin is warm and dry. No rash noted.  Psychiatric: He has a normal mood and affect.  Nursing note and vitals reviewed.    ED Treatments / Results  Labs (all labs ordered are listed, but only abnormal results are displayed) Labs Reviewed  CBC - Abnormal; Notable for the following components:      Result Value   WBC 10.9 (*)  RBC 6.26 (*)    Hemoglobin 17.9 (*)    HCT 53.9 (*)    All other components within normal limits  DIFFERENTIAL - Abnormal; Notable for the following components:   Eosinophils Absolute 0.6 (*)    All other components within normal limits  COMPREHENSIVE METABOLIC PANEL - Abnormal; Notable for the following components:   Glucose, Bld 202 (*)    AST 42 (*)    ALT 57 (*)    All other components within normal limits  C-REACTIVE PROTEIN - Abnormal; Notable for the following components:   CRP 1.2 (*)    All other components within normal limits  I-STAT CHEM 8, ED - Abnormal; Notable for the following components:   Glucose, Bld 213 (*)    Hemoglobin 17.7 (*)    All other components within normal limits  ETHANOL  PROTIME-INR  APTT  SEDIMENTATION RATE  RAPID URINE DRUG SCREEN, HOSP PERFORMED  URINALYSIS, ROUTINE W REFLEX MICROSCOPIC  ACETYLCHOLINE RECEPTOR AB, ALL  TSH  T4  I-STAT TROPONIN, ED    EKG EKG Interpretation  Date/Time:  Sunday August 16 2018 11:47:21 EST Ventricular Rate:  84 PR Interval:    QRS Duration: 88 QT Interval:  370 QTC Calculation: 438 R Axis:   19 Text Interpretation:  Age not entered, assumed to be  62 years old for purpose of ECG interpretation Sinus rhythm  No significant change since last tracing Confirmed by Dorie Rank [phone]) on 08/16/2018 11:56:23 AM Also confirmed by Dorie Rank [phone]), editor Philomena Doheny [phone])  on 08/16/2018 2:32:07 PM   Radiology Mr Brain Wo Contrast  Result Date: 08/16/2018 CLINICAL DATA:  Blurry vision in the RIGHT eye earlier today. Evaluate for stroke. Headache. EXAM: MRI HEAD WITHOUT CONTRAST TECHNIQUE: Multiplanar, multiecho pulse sequences of the brain and surrounding structures were obtained without intravenous contrast. COMPARISON:  Code stroke CT earlier today. FINDINGS: Brain: No evidence for acute infarction, hemorrhage, mass lesion, hydrocephalus, or extra-axial fluid. Normal for age cerebral volume. Mild subcortical and periventricular T2 and FLAIR hyperintensities, likely chronic microvascular ischemic change. Normal visualized orbital and intracranial optic pathways. Vascular: Flow voids are maintained throughout the carotid, basilar, and vertebral arteries. There are no areas of chronic hemorrhage. Skull and upper cervical spine: Unremarkable visualized calvarium, skullbase, and cervical vertebrae. Pituitary, pineal, cerebellar tonsils unremarkable. No upper cervical cord lesions. Sinuses/Orbits: No orbital masses or proptosis. Globes appear symmetric. Sinuses appear well aerated, without evidence for air-fluid level. Other: None. IMPRESSION: Mild small vessel disease. No acute intracranial findings. Specifically, no acute stroke. No acute or focal abnormality is seen in the orbits, nor along the intracranial visual pathways. Good general agreement with prior code stroke CT earlier today. Electronically Signed   By: Staci Righter M.D.   On: 08/16/2018 15:56   Ct Head Code Stroke Wo Contrast  Result Date: 08/16/2018 CLINICAL DATA:  Code stroke. Headache beginning today. Abnormal vision in the right eye. EXAM: CT HEAD WITHOUT CONTRAST TECHNIQUE: Contiguous axial images were obtained from the base of the skull  through the vertex without intravenous contrast. COMPARISON:  03/06/2010 FINDINGS: Brain: Normal appearance without evidence of old or acute infarction, mass lesion, hemorrhage, hydrocephalus or extra-axial collection. Vascular: No abnormal vascular finding. Skull: Normal Sinuses/Orbits: Clear/normal Other: None ASPECTS ([place] Stroke Program Early CT Score) - Ganglionic level infarction (caudate, lentiform nuclei, internal capsule, insula, M1-M3 cortex): 7 - Supraganglionic infarction (M4-M6 cortex): 3 Total score (0-10 with 10 being normal): 10 IMPRESSION: 1. Normal head CT 2. ASPECTS is 10.  3. These results were called by telephone at the time of interpretation on 08/16/2018 at 12:06 pm to Dr. Dorie Rank , who verbally acknowledged these results. Electronically Signed   By: Nelson Chimes M.D.   On: 08/16/2018 12:08    Procedures Procedures (including critical care time)  Medications Ordered in ED Medications  sodium chloride 0.9 % bolus 500 mL (0 mLs Intravenous Stopped 08/16/18 1430)    Followed by  0.9 %  sodium chloride infusion (100 mL/hr Intravenous New Bag/Given 08/16/18 1159)     Initial Impression / Assessment and Plan / ED Course  I have reviewed the triage vital signs and the nursing notes.  Pertinent labs & imaging results that were available during my care of the patient were reviewed by me and considered in my medical decision making (see chart for details).  Clinical Course as of Aug 17 1619  Nancy Fetter Aug 16, 2018  1208 Normal head CT per Dr Maree Erie   [JK]  1216 Discussed with neurology (tele).  Not a tpa candidate.   Check CRP and ESR possible temporal arteritis.  Add on an MRI   [JK]  1537 ESR is not evaluated.  CRP slightly elevated.   [JK]  2336 Case discussed with Dr Ellie Lunch.  Added on labs as requested.  Pt will follow up with her tomorrow in the office   [JK]    Clinical Course User Index [JK] Dorie Rank, MD  Pt has acute onset of diplopia.  Sx started 2 hours ago.   Some difficulty with finger to nose exam but likely related to the visual issues.  Sx concerning for the possibility of acute stroke.  Will activate code stroke.  Proceed with further workup  Patient was seen by neurology and he was not a TPA candidate.  MRI was performed in the ED and ultimately the patient has no findings of an acute stroke.  Sed rate and CRP are not significantly elevated.  I doubt temporal arteritis.  I discussed the case with Dr. Ellie Lunch.  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   Pt will follow up with her in the office tomorrow.  Final Clinical Impressions(s) / ED Diagnoses   Final diagnoses:  Diplopia    ED Discharge Orders    None       Dorie Rank, MD 08/16/18 1620

## 2018-08-18 LAB — T4: T4 TOTAL: 7 ug/dL (ref 4.5–12.0)

## 2018-08-25 LAB — ACETYLCHOLINE RECEPTOR AB, ALL
Acety choline binding ab: <0.03 nmol/L (ref 0.00–0.24)
Acetylchol Block Ab: 14 % (ref 0–25)
Acetylcholine Modulat Ab: <12 % (ref 0–20)
